# Patient Record
Sex: Female | Born: 1975 | Race: White | Hispanic: No | Marital: Married | Smoking: Never smoker
Health system: Southern US, Community
[De-identification: ages and names within clinical notes are randomized; demographics above are authoritative.]

## PROBLEM LIST (undated history)

## (undated) DIAGNOSIS — R87629 Unspecified abnormal cytological findings in specimens from vagina: Secondary | ICD-10-CM

## (undated) DIAGNOSIS — Z8669 Personal history of other diseases of the nervous system and sense organs: Secondary | ICD-10-CM

## (undated) DIAGNOSIS — IMO0002 Reserved for concepts with insufficient information to code with codable children: Secondary | ICD-10-CM

## (undated) DIAGNOSIS — T4145XA Adverse effect of unspecified anesthetic, initial encounter: Secondary | ICD-10-CM

## (undated) DIAGNOSIS — I10 Essential (primary) hypertension: Secondary | ICD-10-CM

## (undated) DIAGNOSIS — R87619 Unspecified abnormal cytological findings in specimens from cervix uteri: Secondary | ICD-10-CM

## (undated) DIAGNOSIS — Z789 Other specified health status: Secondary | ICD-10-CM

## (undated) DIAGNOSIS — J45909 Unspecified asthma, uncomplicated: Secondary | ICD-10-CM

## (undated) DIAGNOSIS — Z309 Encounter for contraceptive management, unspecified: Secondary | ICD-10-CM

## (undated) HISTORY — PX: WISDOM TOOTH EXTRACTION: SHX21

## (undated) HISTORY — DX: Reserved for concepts with insufficient information to code with codable children: IMO0002

## (undated) HISTORY — DX: Essential (primary) hypertension: I10

## (undated) HISTORY — PX: LAPAROSCOPIC SALPINGOOPHERECTOMY: SUR795

## (undated) HISTORY — DX: Unspecified abnormal cytological findings in specimens from vagina: R87.629

## (undated) HISTORY — PX: APPENDECTOMY: SHX54

## (undated) HISTORY — DX: Personal history of other diseases of the nervous system and sense organs: Z86.69

## (undated) HISTORY — PX: OTHER SURGICAL HISTORY: SHX169

## (undated) HISTORY — DX: Encounter for contraceptive management, unspecified: Z30.9

## (undated) HISTORY — DX: Unspecified abnormal cytological findings in specimens from cervix uteri: R87.619

## (undated) HISTORY — DX: Unspecified asthma, uncomplicated: J45.909

## (undated) HISTORY — PX: ABDOMINAL HYSTERECTOMY: SHX81

---

## 1998-02-18 ENCOUNTER — Inpatient Hospital Stay (HOSPITAL_COMMUNITY): Admission: EM | Admit: 1998-02-18 | Discharge: 1998-03-05 | Payer: Self-pay | Admitting: *Deleted

## 1998-03-11 ENCOUNTER — Inpatient Hospital Stay (HOSPITAL_COMMUNITY): Admission: EM | Admit: 1998-03-11 | Discharge: 1998-03-19 | Payer: Self-pay | Admitting: Emergency Medicine

## 1998-03-20 ENCOUNTER — Encounter (HOSPITAL_COMMUNITY): Admission: RE | Admit: 1998-03-20 | Discharge: 1998-06-18 | Payer: Self-pay

## 1998-04-22 ENCOUNTER — Emergency Department (HOSPITAL_COMMUNITY): Admission: EM | Admit: 1998-04-22 | Discharge: 1998-04-22 | Payer: Self-pay | Admitting: Emergency Medicine

## 1999-02-07 ENCOUNTER — Ambulatory Visit (HOSPITAL_COMMUNITY): Admission: RE | Admit: 1999-02-07 | Discharge: 1999-02-07 | Payer: Self-pay | Admitting: Specialist

## 1999-02-07 ENCOUNTER — Encounter: Payer: Self-pay | Admitting: Specialist

## 2000-07-14 ENCOUNTER — Emergency Department (HOSPITAL_COMMUNITY): Admission: EM | Admit: 2000-07-14 | Discharge: 2000-07-14 | Payer: Self-pay | Admitting: Emergency Medicine

## 2000-07-14 ENCOUNTER — Encounter: Payer: Self-pay | Admitting: Emergency Medicine

## 2000-08-22 ENCOUNTER — Other Ambulatory Visit: Admission: RE | Admit: 2000-08-22 | Discharge: 2000-08-22 | Payer: Self-pay | Admitting: Obstetrics and Gynecology

## 2000-08-25 ENCOUNTER — Encounter: Payer: Self-pay | Admitting: Obstetrics and Gynecology

## 2000-08-25 ENCOUNTER — Ambulatory Visit (HOSPITAL_COMMUNITY): Admission: RE | Admit: 2000-08-25 | Discharge: 2000-08-25 | Payer: Self-pay | Admitting: Obstetrics and Gynecology

## 2000-10-08 ENCOUNTER — Emergency Department (HOSPITAL_COMMUNITY): Admission: EM | Admit: 2000-10-08 | Discharge: 2000-10-08 | Payer: Self-pay | Admitting: Emergency Medicine

## 2000-11-02 ENCOUNTER — Ambulatory Visit (HOSPITAL_COMMUNITY): Admission: RE | Admit: 2000-11-02 | Discharge: 2000-11-02 | Payer: Self-pay | Admitting: Obstetrics and Gynecology

## 2000-11-02 ENCOUNTER — Encounter: Payer: Self-pay | Admitting: Obstetrics and Gynecology

## 2000-12-12 ENCOUNTER — Encounter: Payer: Self-pay | Admitting: Obstetrics and Gynecology

## 2000-12-12 ENCOUNTER — Ambulatory Visit (HOSPITAL_COMMUNITY): Admission: RE | Admit: 2000-12-12 | Discharge: 2000-12-12 | Payer: Self-pay | Admitting: Obstetrics and Gynecology

## 2001-02-12 ENCOUNTER — Encounter: Payer: Self-pay | Admitting: Obstetrics and Gynecology

## 2001-02-12 ENCOUNTER — Ambulatory Visit (HOSPITAL_COMMUNITY): Admission: RE | Admit: 2001-02-12 | Discharge: 2001-02-12 | Payer: Self-pay | Admitting: Obstetrics and Gynecology

## 2001-08-20 ENCOUNTER — Inpatient Hospital Stay (HOSPITAL_COMMUNITY): Admission: EM | Admit: 2001-08-20 | Discharge: 2001-08-29 | Payer: Self-pay | Admitting: Psychiatry

## 2001-09-03 ENCOUNTER — Other Ambulatory Visit (HOSPITAL_COMMUNITY): Admission: RE | Admit: 2001-09-03 | Discharge: 2001-09-08 | Payer: Self-pay | Admitting: Psychiatry

## 2001-09-09 ENCOUNTER — Inpatient Hospital Stay (HOSPITAL_COMMUNITY): Admission: AD | Admit: 2001-09-09 | Discharge: 2001-09-26 | Payer: Self-pay | Admitting: Psychiatry

## 2001-10-03 ENCOUNTER — Encounter: Payer: Self-pay | Admitting: Emergency Medicine

## 2001-10-03 ENCOUNTER — Inpatient Hospital Stay (HOSPITAL_COMMUNITY): Admission: EM | Admit: 2001-10-03 | Discharge: 2001-10-04 | Payer: Self-pay | Admitting: Psychiatry

## 2001-10-03 ENCOUNTER — Emergency Department (HOSPITAL_COMMUNITY): Admission: EM | Admit: 2001-10-03 | Discharge: 2001-10-03 | Payer: Self-pay | Admitting: Emergency Medicine

## 2001-12-12 ENCOUNTER — Emergency Department (HOSPITAL_COMMUNITY): Admission: EM | Admit: 2001-12-12 | Discharge: 2001-12-12 | Payer: Self-pay | Admitting: *Deleted

## 2001-12-12 ENCOUNTER — Encounter: Payer: Self-pay | Admitting: Obstetrics and Gynecology

## 2001-12-12 ENCOUNTER — Ambulatory Visit (HOSPITAL_COMMUNITY): Admission: RE | Admit: 2001-12-12 | Discharge: 2001-12-12 | Payer: Self-pay | Admitting: Obstetrics and Gynecology

## 2001-12-19 ENCOUNTER — Inpatient Hospital Stay (HOSPITAL_COMMUNITY): Admission: RE | Admit: 2001-12-19 | Discharge: 2001-12-21 | Payer: Self-pay | Admitting: Obstetrics & Gynecology

## 2001-12-30 ENCOUNTER — Inpatient Hospital Stay (HOSPITAL_COMMUNITY): Admission: EM | Admit: 2001-12-30 | Discharge: 2001-12-30 | Payer: Self-pay | Admitting: Internal Medicine

## 2002-07-14 ENCOUNTER — Encounter: Payer: Self-pay | Admitting: Internal Medicine

## 2002-07-14 ENCOUNTER — Emergency Department (HOSPITAL_COMMUNITY): Admission: EM | Admit: 2002-07-14 | Discharge: 2002-07-14 | Payer: Self-pay | Admitting: Internal Medicine

## 2002-08-05 ENCOUNTER — Encounter: Payer: Self-pay | Admitting: Preventative Medicine

## 2002-08-05 ENCOUNTER — Ambulatory Visit (HOSPITAL_COMMUNITY): Admission: RE | Admit: 2002-08-05 | Discharge: 2002-08-05 | Payer: Self-pay | Admitting: Preventative Medicine

## 2003-01-27 ENCOUNTER — Ambulatory Visit (HOSPITAL_COMMUNITY): Admission: RE | Admit: 2003-01-27 | Discharge: 2003-01-27 | Payer: Self-pay | Admitting: Family Medicine

## 2003-02-07 ENCOUNTER — Ambulatory Visit (HOSPITAL_COMMUNITY): Admission: RE | Admit: 2003-02-07 | Discharge: 2003-02-07 | Payer: Self-pay | Admitting: Family Medicine

## 2003-05-01 ENCOUNTER — Ambulatory Visit (HOSPITAL_COMMUNITY): Admission: RE | Admit: 2003-05-01 | Discharge: 2003-05-01 | Payer: Self-pay | Admitting: Family Medicine

## 2003-05-19 ENCOUNTER — Ambulatory Visit (HOSPITAL_COMMUNITY): Admission: RE | Admit: 2003-05-19 | Discharge: 2003-05-19 | Payer: Self-pay | Admitting: Internal Medicine

## 2003-06-02 ENCOUNTER — Ambulatory Visit (HOSPITAL_COMMUNITY): Admission: RE | Admit: 2003-06-02 | Discharge: 2003-06-02 | Payer: Self-pay | Admitting: Obstetrics and Gynecology

## 2003-06-12 ENCOUNTER — Emergency Department (HOSPITAL_COMMUNITY): Admission: EM | Admit: 2003-06-12 | Discharge: 2003-06-12 | Payer: Self-pay | Admitting: Emergency Medicine

## 2003-10-07 ENCOUNTER — Emergency Department (HOSPITAL_COMMUNITY): Admission: EM | Admit: 2003-10-07 | Discharge: 2003-10-07 | Payer: Self-pay | Admitting: Emergency Medicine

## 2003-11-14 ENCOUNTER — Emergency Department (HOSPITAL_COMMUNITY): Admission: EM | Admit: 2003-11-14 | Discharge: 2003-11-14 | Payer: Self-pay | Admitting: Emergency Medicine

## 2003-12-27 ENCOUNTER — Emergency Department (HOSPITAL_COMMUNITY): Admission: EM | Admit: 2003-12-27 | Discharge: 2003-12-28 | Payer: Self-pay | Admitting: *Deleted

## 2004-04-14 ENCOUNTER — Ambulatory Visit (HOSPITAL_COMMUNITY): Admission: RE | Admit: 2004-04-14 | Discharge: 2004-04-14 | Payer: Self-pay | Admitting: Obstetrics & Gynecology

## 2004-10-27 ENCOUNTER — Other Ambulatory Visit: Admission: RE | Admit: 2004-10-27 | Discharge: 2004-10-27 | Payer: Self-pay | Admitting: Obstetrics & Gynecology

## 2004-11-24 ENCOUNTER — Other Ambulatory Visit: Admission: RE | Admit: 2004-11-24 | Discharge: 2004-11-24 | Payer: Self-pay | Admitting: Obstetrics & Gynecology

## 2004-12-22 ENCOUNTER — Ambulatory Visit (HOSPITAL_COMMUNITY): Admission: RE | Admit: 2004-12-22 | Discharge: 2004-12-22 | Payer: Self-pay | Admitting: Preventative Medicine

## 2004-12-30 ENCOUNTER — Encounter (HOSPITAL_COMMUNITY): Admission: RE | Admit: 2004-12-30 | Discharge: 2005-01-29 | Payer: Self-pay | Admitting: Preventative Medicine

## 2005-01-31 ENCOUNTER — Ambulatory Visit: Payer: Self-pay | Admitting: Orthopedic Surgery

## 2005-05-15 ENCOUNTER — Emergency Department (HOSPITAL_COMMUNITY): Admission: EM | Admit: 2005-05-15 | Discharge: 2005-05-15 | Payer: Self-pay | Admitting: Emergency Medicine

## 2006-03-11 ENCOUNTER — Emergency Department (HOSPITAL_COMMUNITY): Admission: EM | Admit: 2006-03-11 | Discharge: 2006-03-11 | Payer: Self-pay | Admitting: Emergency Medicine

## 2006-03-15 ENCOUNTER — Ambulatory Visit (HOSPITAL_COMMUNITY): Admission: RE | Admit: 2006-03-15 | Discharge: 2006-03-15 | Payer: Self-pay | Admitting: Orthopaedic Surgery

## 2007-03-13 ENCOUNTER — Other Ambulatory Visit: Admission: RE | Admit: 2007-03-13 | Discharge: 2007-03-13 | Payer: Self-pay | Admitting: Obstetrics and Gynecology

## 2009-03-07 DIAGNOSIS — T8859XA Other complications of anesthesia, initial encounter: Secondary | ICD-10-CM

## 2009-03-07 HISTORY — DX: Other complications of anesthesia, initial encounter: T88.59XA

## 2009-05-07 ENCOUNTER — Other Ambulatory Visit: Admission: RE | Admit: 2009-05-07 | Discharge: 2009-05-07 | Payer: Self-pay | Admitting: Obstetrics and Gynecology

## 2009-07-01 ENCOUNTER — Ambulatory Visit (HOSPITAL_COMMUNITY): Admission: RE | Admit: 2009-07-01 | Discharge: 2009-07-01 | Payer: Self-pay | Admitting: Obstetrics & Gynecology

## 2009-11-25 ENCOUNTER — Encounter: Payer: Self-pay | Admitting: Obstetrics & Gynecology

## 2009-11-25 ENCOUNTER — Inpatient Hospital Stay (HOSPITAL_COMMUNITY): Admission: RE | Admit: 2009-11-25 | Discharge: 2009-11-28 | Payer: Self-pay | Admitting: Obstetrics & Gynecology

## 2010-03-27 ENCOUNTER — Encounter: Payer: Self-pay | Admitting: Family Medicine

## 2010-03-28 ENCOUNTER — Encounter: Payer: Self-pay | Admitting: Internal Medicine

## 2010-05-20 LAB — CBC
HCT: 31.1 % — ABNORMAL LOW (ref 36.0–46.0)
HCT: 36.7 % (ref 36.0–46.0)
Hemoglobin: 10.9 g/dL — ABNORMAL LOW (ref 12.0–15.0)
Hemoglobin: 12.5 g/dL (ref 12.0–15.0)
MCH: 31 pg (ref 26.0–34.0)
MCH: 32 pg (ref 26.0–34.0)
MCHC: 34.1 g/dL (ref 30.0–36.0)
MCHC: 35.1 g/dL (ref 30.0–36.0)
MCV: 91 fL (ref 78.0–100.0)
MCV: 91 fL (ref 78.0–100.0)
Platelets: 181 10*3/uL (ref 150–400)
Platelets: 231 10*3/uL (ref 150–400)
RBC: 3.42 MIL/uL — ABNORMAL LOW (ref 3.87–5.11)
RBC: 4.04 MIL/uL (ref 3.87–5.11)
RDW: 14.6 % (ref 11.5–15.5)
RDW: 14.8 % (ref 11.5–15.5)
WBC: 10.9 10*3/uL — ABNORMAL HIGH (ref 4.0–10.5)
WBC: 8.8 10*3/uL (ref 4.0–10.5)

## 2010-05-20 LAB — RPR: RPR Ser Ql: NONREACTIVE

## 2010-05-20 LAB — SURGICAL PCR SCREEN
MRSA, PCR: NEGATIVE
Staphylococcus aureus: NEGATIVE

## 2010-07-23 NOTE — H&P (Signed)
NAME:  Katherine Davila, Katherine Davila                           ACCOUNT NO.:  [id number]   MEDICAL RECORD NO.:  [id number]                   PATIENT TYPE:  AMB   LOCATION:  DAY                                  FACILITY:  [facility]   PHYSICIAN:  Lazaro Arms, M.D.                DATE OF BIRTH:  [birth year]   DATE OF ADMISSION:  12/19/2001  DATE OF DISCHARGE:                                HISTORY & PHYSICAL   HISTORY OF PRESENT ILLNESS:  The patient is a 35 year old gravida 1, para 0,  abortus 1, with last menstrual period of September 13th, and a Depo-Provera  for birth control, who has ongoing symptoms of right lower quadrant pain.  The patient has been seen in the ER twice and in our office on several  occasions.  She has had gonorrhea and Chlamydia which are negative, negative  urine cultures.  She has been treated empirically for infection.  Her GC and  Chlamydia cultures were negative.  She had a transvaginal ultrasound which  showed a relatively small right ovarian cyst of 1.8 x 1.8 x 1.0 cm,  consistent with hemorrhagic corpus luteum.  On examination, this is what is  exquisitely tender.  The patient has more nausea and vomiting than we  normally see with this, I do not think there is ovarian torsion, so I have  also permitted her for a laparoscopic appendectomy.   PAST MEDICAL HISTORY:  She is bipolar I.   PAST SURGICAL HISTORY:  Negative.   PAST OBSTETRICAL HISTORY:  Miscarriage.   MEDICATIONS:  Her medications include Depakote, Geodon,  Ambien, Effexor and  Klonopin.   ALLERGIES:  Her allergies are to PENICILLIN and ERYTHROMYCIN.   REVIEW OF SYSTEMS:  As per the HPI.   PHYSICAL EXAMINATION:  VITAL SIGNS:  Blood pressure is 110/80.  Her weight  is 146 pounds.  HEENT:  Unremarkable.  NECK:  Her thyroid is normal.  LUNGS:  Lungs are clear.  HEART:  Heart is regular rate and rhythm without murmur, regurgitation or  gallop.  BREASTS:  Breasts are without mass, discharge or skin  changes.  ABDOMEN:  Benign except for tenderness in the right lower quadrant.  No  rebound.  PELVIC:  Her pelvic reveals no cervical motion tenderness, tenderness in the  right adnexa.  Uterus is nontender.  Left adnexa was negative.  EXTREMITIES:  Extremities are warm with no edema.  NEUROLOGIC:  Exam is grossly intact.   IMPRESSION:  1. Right lower quadrant pain.  2. Unresponsive to conservative measures.  3. Right ovarian cyst.   PLAN:  The patient has more peritoneal signs than I am used to, so she is  permitted for possible laparoscopic appendectomy as well as cystectomy.  The  patient understands risks, benefits, indications and alternatives and will  proceed.  Lazaro Arms, M.D.    Loraine Maple  D:  12/19/2001  T:  12/19/2001  Job:  [id number]

## 2010-07-23 NOTE — Discharge Summary (Signed)
   NAME:  Katherine Davila, Katherine Davila                           ACCOUNT NO.:  [id number]   MEDICAL RECORD NO.:  [id number]                   PATIENT TYPE:  INP   LOCATION:  A303                                 FACILITY:  [facility]   PHYSICIAN:  Lazaro Arms, M.D.                DATE OF BIRTH:  May 13, 1975   DATE OF ADMISSION:  12/19/2001  DATE OF DISCHARGE:  12/21/2001                                 DISCHARGE SUMMARY   DISCHARGE DIAGNOSIS:  Status post laparoscopic right ovarian cystectomy and  laparoscopic appendectomy.   HISTORY OF PRESENT ILLNESS:  Please refer to the history and physical and  the dictated operative note for details on admission to the hospital.   HOSPITAL COURSE:  The patient is bipolar I and had made a suicidal gesture  earlier this year and, as a result, I wanted to keep her in the hospital  postoperatively.  She has a high need level, and that was confirmed with her  postoperative stay.  She had a lot of complaints of pain but would sleep  normally.  On the morning of the second postoperative day her H&H was  stable.  Her incisions were clean, dry, and intact.  She had some bruising  around the umbilical incision which was predictable because she had some  muscle oozing, but she remained afebrile, with stable vital signs.  Tolerating clear liquids and a regular diet.  Voided without symptoms and  ambulated without symptoms.  She was discharged home in good and stable  condition, to follow up in the office on Monday as scheduled.  She was given  Toradol and Demerol for pain and Levaquin prophylactically because of the  appendectomy.                                               Lazaro Arms, M.D.    Loraine Maple  D:  12/21/2001  T:  12/21/2001  Job:  [id number]

## 2010-07-23 NOTE — Procedures (Signed)
   NAME:  Katherine Davila, Katherine Davila                           ACCOUNT NO.:  [id number]   MEDICAL RECORD NO.:  [id number]                   PATIENT TYPE:  INP   LOCATION:  IC08                                 FACILITY:  [facility]   PHYSICIAN:  Edward L. Juanetta Gosling, M.D.             DATE OF BIRTH:  Apr 11, 1975   DATE OF PROCEDURE:  DATE OF DISCHARGE:  12/30/2001                                EKG INTERPRETATION   TIME AND DATE:  5:05 on 12/30/01   INTERPRETATION:  The rhythm is sinus rhythm with a rate in the 70s.   IMPRESSION:  Otherwise normal electrocardiogram.                                               Edward L. Juanetta Gosling, M.D.    ELH/MEDQ  D:  12/31/2001  T:  01/01/2002  Job:  [id number]

## 2010-07-23 NOTE — H&P (Signed)
NAME:  Katherine Davila, Katherine Davila                           ACCOUNT NO.:  [id number]   MEDICAL RECORD NO.:  [id number]                   PATIENT TYPE:  AMB   LOCATION:  DAY                                  FACILITY:  [facility]   PHYSICIAN:  Tilda Burrow, M.D.              DATE OF BIRTH:  [birth year]   DATE OF ADMISSION:  06/02/2003  DATE OF DISCHARGE:                                HISTORY & PHYSICAL   ADMITTING DIAGNOSES:  1. Pelvic discomfort, uterine retroversion.  2. History of ovarian cyst requiring laparoscopic removal.  3. Bipolar disorder with history of bulimia and multiple suicidal gestures.   HISTORY OF PRESENT ILLNESS:  This 35 year old female, gravida 1 para 0, with  a longstanding history of bipolar disorder, is admitted at this time for day  surgery at [facility] for laparoscopic evaluation of the pelvis and  consideration of uterine suspension.  Ms. Katherine Davila has been seen in our office  in followup of her 2003 laparoscopy.  She has developed complaints of pelvic  discomfort, more on the right than the left.  She has a history of right  ovarian cystectomy and appendectomy.  She is not sexually active at present,  has a stable relationship, has, over the past year, apparently done well  with her bipolar disorder, and multiple upwards of 70 suicide attempts  which have resulted in multiple long-term psychiatric hospitalizations.  Most recently hospitalization in this hospital was December 30, 2001, a  couple of months after a laparoscopy, when she had made a suicidal gesture  on the anniversary of a miscarriage.   MEDICATIONS:  Unknown doses but include Depakote, Geodon, __________ ,  Effexor XR.   ALLERGIES:  PENICILLIN and ERYTHROMYCIN.   SOCIAL HISTORY:  The patient has an ongoing stable relationship at the  current time.   PHYSICAL EXAM:  GENERAL:  Height 5 feet 5 inches, weight approximately 140.  HEENT:  Pupils equal, round and reactive.  Extraocular movements  intact.  CARDIOVASCULAR:  Exam unremarkable.  ABDOMEN:  Abdomen nontender.  PELVIC:  Normal external genitalia.  Uterus retroverted, non-purulent  cervix.  Adnexa:  Vague tenderness, as is tenderness noted with the uterine  contact in its retroverted position during bimanual exam.   PLAN:  Plan is for laparoscopic evaluation of the right adnexa to rule out  adhesions, remove them if noted, evaluate the right tube and ovary and  release any adhesions in that area.  Additionally, plans are to either  shorten the round ligaments or to suspend the uterus to the anterior  abdominal wall to reduce the retroversion and associated pelvic discomfort.  The patient understands that these are usually procedures that subsequently  work for a period of time  only and that subsequent childbearing, if it should occur, will likely re-  lengthen the ligaments and lead to recurrence of any uterine retroversion  and pelvic discomfort related to it.  Additionally, the patient has had  specific review that this is not a guaranteed procedure and that pelvis  discomfort may persist, even after uterine suspension.     ___________________________________________                                         Tilda Burrow, M.D.   JVF/MEDQ  D:  06/02/2003  T:  06/02/2003  Job:  [id number]

## 2010-07-23 NOTE — H&P (Signed)
NAME:  Katherine Davila NO.:  [id number]   MEDICAL RECORD NO.:  [id number]          PATIENT TYPE:  AMB   LOCATION:  DAY                           FACILITY:  [facility]   PHYSICIAN:  Lazaro Arms, M.D.   DATE OF BIRTH:  1975/03/10   DATE OF ADMISSION:  04/14/2004  DATE OF DISCHARGE:  LH                                HISTORY & PHYSICAL   HISTORY OF PRESENT ILLNESS:  Katherine Davila is a 35 year old, white female, gravida  0, para 0, who has been having ongoing right lower quadrant pain.  Of note,  the patient has an extensive psychiatric history including multiple suicide  attempts, most recently she has been stable and actually doing pretty well  as far as that goes.  The patient has really been complaining about right  lower quadrant pain for the last several years.  We ended up doing a  laparoscopic right ovarian cystectomy and a laparoscopic appendectomy, back  on December 19, 2001.  The rest of the pelvis was normal.  There was no  endometriosis seen at that time.  The patient then seemed to do much better;  however, she began to have pelvic pain again and, on June 02, 2003, the  patient had once again diagnostic laparoscopy and was found to have minimal  to mild endometriosis and a small amount of clinically insignificant  adhesions to the anterior abdominal wall.  The patient continued to have  pain and I put her on six months of Lupron at which time her pain did  improve, and then continuous birth control pills and her pain has returned.  It seems to be again markedly on the right and as a result of ongoing pain,  despite all conservative measures, we are proceeding with a laparoscopic  right salpingo-oophorectomy.  The patient understands risks, benefits,  indications, and alternatives and desires to proceed.   PAST MEDICAL HISTORY:  1.  Bipolar.  2.  Posttraumatic stress disorder.  3.  An extensive psychiatric with multiple suicidal gestures.   PAST SURGICAL HISTORY:   As stated above.   PAST OBSTETRICAL HISTORY:  Negative.   ALLERGIES:  1.  ERYTHROMYCIN.  2.  PENICILLIN.   REVIEW OF SYSTEMS:  Otherwise negative.   PHYSICAL EXAMINATION:  HEENT:  Unremarkable.  Thyroid is normal.  LUNGS:  Clear.  HEART:  Regular rate and rhythm without regurg, gallop.  BREASTS:  Without masses, discharge, skin changes.  ABDOMEN:  Benign with tenderness in the right lower quadrant.  No rebound.  The rest of the abdominal exam is benign.  PELVIC:  She has no cervical motion tenderness.  She has normal external  genitalia.  moist without discharge.  The left adnexa is negative.  The  uterus is negative.  The right adnexa, however, is normal size but tender to  palpation.  EXTREMITIES:  Warm and no edema.  NEUROLOGIC:  Grossly intact.   IMPRESSION:  Ongoing right lower quadrant pain, despite conservative  measures.   PLAN:  The patient is admitted for a laparoscopic right salpingo-  oophorectomy.  She understands risks,  benefits, indications, and  alternatives.  We will proceed.      LHE/MEDQ  D:  04/13/2004  T:  04/13/2004  Job:  [id number]

## 2010-07-23 NOTE — Discharge Summary (Signed)
[facility]  Patient:    Katherine Davila, Katherine Davila Visit Number: [id number] MRN: [id number]          Service Type: PSY Location: 300 [id number] 01 Attending Physician:  Rachael Fee Dictated by:   Jeanice Lim, M.D. Admit Date:  09/09/2001 Discharge Date: 09/26/2001                             Discharge Summary  IDENTIFYING DATA:  This is a 35 year old single Caucasian female voluntarily admitted for depression status post intentional overdose.  MEDICATIONS:  Effexor, Klonopin, Ambien, birth control pill and Darvocet.  ALLERGIES:  PENICILLIN and ERYTHROMYCIN.  PHYSICAL EXAMINATION:  Essentially within normal limits.  Neurologically nonfocal.  LABORATORY DATA:  Routine admission labs within normal limits.  Potassium slightly low at 3.4.  Urine drug screen negative.  MENTAL STATUS EXAMINATION:  Alert, young adult female with good eye contact. Cooperative.  Speech clear.  Mood depressed.  Affect flat, angry at the end. Thought process goal directed.  Thought content negative for dangerous ideation or psychotic symptoms.  Cognitively intact.  Judgment and insight impaired.  ADMISSION DIAGNOSES: Axis I:    1. Bipolar disorder, depressed.            2. Bulimia nervosa. Axis II:   None. Axis III:  History of ovarian cyst. Axis IV:   Moderate (problems with primary support and other psychosocial            issues). Axis V:    35/55.  HOSPITAL COURSE:  The patient was admitted following an overdose on Klonopin, taking 10 mg in the past 24 hours.  The patient denied suicidal intent stating that she just wanted to sleep.  Effexor was optimized for depressive symptoms. Klonopin was decreased in dose and patient had a nutrition consult regarding an eating disorder.  The patient would not take in adequate fluids or food initially and was somewhat resistant to treatment.  Irritable and angry with some suicidal thoughts.  The patient gradually improved as  medications were stabilized.  Klonopin was tapered due to excessive sedation and Neurontin decreased secondary to lack of response.  The patient gradually became more cooperative, participating in individual, group and milieu therapy, benefiting from clinical intervention and medication changes.  The patient agreed to follow up with intensive outpatient.  Family meetings were held to discuss aftercare and family was comfortable with discharge plan.  The patient denied any suicidal ideation at the time of discharge as well as her mood was more euthymic.  Affect brighter.  CONDITION ON DISCHARGE:  Improved.  DISCHARGE MEDICATIONS: 1. Darvocet 2 q.8h. p.r.n. pain.  Given a one-week supply. 2. Effexor XR 75 mg, 2 q.a.m. and 1 at 3 p.m. 3. Ambien 10 mg q.h.s. p.r.n. 4. Flexeril 10 mg q.12h. p.r.n. spasm. 5. Klonopin 0.5 mg, 1/2 q.a.m., 1/2 at 3 p.m. and 1 q.h.s.  FOLLOW-UP:  Intensive outpatient program Monday, September 03, 2001 at 9 a.m.  DISCHARGE DIAGNOSES: Axis I:    1. Bipolar disorder, depressed.            2. Bulimia nervosa. Axis II:   Borderline personality disorder. Axis III:  History of ovarian cyst. Axis IV:   Moderate (problems with primary support and other psychosocial            issues). Axis V:    Global Assessment of Functioning on discharge 55. Dictated by:   Jeanice Lim, M.D.  Attending Physician:  Rachael Fee DD:  09/26/01 TD:  09/28/01 Job: [id number]

## 2010-07-23 NOTE — H&P (Signed)
[facility]  Patient:    Katherine Davila, Katherine Davila Visit Number: [id number] MRN: [id number]          Service Type: EMS Location: ED Attending Physician:  Carmelina Peal Dictated by:   Candi Leash. Orsini, N.P. Admit Date:  08/20/2001 Discharge Date: 08/20/2001                     Psychiatric Admission Assessment  DATE OF ADMISSION:  August 20, 2001  PATIENT IDENTIFICATION:  This is a 35 year old single white female voluntarily admitted for depression and intentional overdose on August 20, 2001.  HISTORY OF PRESENT ILLNESS:  The patient presents with a history of depression, feeling very hopeless and helpless, requesting assistance so that she will not hurt herself.  The patient had an appointment with her psychiatrist on Monday.  The patient had reported to her psychiatrist she had taken 10 Klonopin on Sunday night so that she would sleep, denying any suicidal thoughts at that time.  She states she feels very miserable and angry and stressed over her breakup with her fiancee, her loss of pregnancy in October 2002.  She states that she "would like to die more than anything else but I cant do it."  She reports a history of 3 suicide attempts with multiple different ways of trying to hurt herself.  States her sleep has been decreased, appetite is decreased.  She has a history of bulimia.  The patient states that she will not eat and has difficulty taking pills.  The patient says she wants her father to be here so that can eat.  PAST PSYCHIATRIC HISTORY:  History of severe depression.  Sees Dr. Evelene Croon as an outpatient; last saw him on August 20, 2001.  First hospitalization to [facility].  Has a history of multiple suicide attempts, poisoning self with Lysol, cutting her wrists, overdosing, and lying on train tracks.  She states her last hospitalization was at [facility] where she was hospitalized for eight months.  SUBSTANCE ABUSE HISTORY:   Nonsmoker.  Rare alcohol intake.  Denies any substance abuse.  PAST MEDICAL HISTORY:  Primary care Rilea Arutyunyan: None.  Medical problems: History of bulimia.  MEDICATIONS: 1. Effexor, has been on that for years.  The patient states she has been on    and off and recently started her medication two weeks ago. 2. Klonopin 1 mg t.i.d. 3. Birth control pills. 4. Ambien 10 mg q.h.s. 5. Darvocet for pain.  DRUG ALLERGIES:  PENICILLIN and ERYTHROMYCIN.  PHYSICAL EXAMINATION:  GENERAL:  Performed at [facility] Emergency Department.  LABORATORY DATA:  CBC: Within normal limits.  Potassium 3.4, BUN 4.  Urine drug screen was negative.  Alcohol level was less than 5.  SOCIAL HISTORY:  She is a 35 year old single white female.  She states she lost a child when she was nine weeks pregnant in October 2002.  She lives with her parents and her brother.  She is on disability for psychiatric problems.  Completed one and a half years of college.  No legal problems.  FAMILY HISTORY:  Grandfather committed suicide.  MENTAL STATUS EXAMINATION:  She is an alert, young adult female, good eye contact.  She is cooperative, wrapped up in a blanket.  Speech is clear.  Mood is depressed.  Affect is flat.  The patient became very angry at the end of the session when was told to drink some fluids and that her father could not be here for her  meals.  Thought processes are coherent.  There is no evidence of psychosis, no auditory or visual hallucinations, no suicidal or homicidal ideations, no delusions or paranoia.  Cognitive: Intact.  Memory is good. Judgment is impaired.  Insight is poor.  ADMISSION DIAGNOSES: Axis I:    1. Bipolar disorder, depressed.            2. Bulimia, by history. Axis II:   Deferred. Axis III:  Ovarian cyst. Axis IV:   Problems with primary support group, grief issues with lost            pregnancy, other psychosocial problems. Axis V:    Current is 35, estimated this past year is  26.  INITIAL PLAN OF CARE:  Plan is a voluntary admission to [facility] for depression and questionable overdose.  Contract for safety.  Check every 15 minutes.  Will resume her Effexor and Klonopin.  Will increase her Effexor to decrease depressive symptoms.  Check a urine pregnancy test. Monitor food and fluid intake.  Follow up with Dr. Evelene Croon.  The patient to be medication compliant, increase coping skills.  ESTIMATED LENGTH OF STAY:  Three to five days or more depending on the patients response. Dictated by:   Candi Leash. Orsini, N.P. Attending Physician:  Carmelina Peal DD:  08/22/01 TD:  08/22/01 Job: [id number]

## 2010-07-23 NOTE — Op Note (Signed)
NAME:  Katherine Davila, Katherine Davila                 ACCOUNT NO.:  [id number]   MEDICAL RECORD NO.:  [id number]          PATIENT TYPE:  AMB   LOCATION:  DAY                           FACILITY:  [facility]   PHYSICIAN:  Lazaro Arms, M.D.   DATE OF BIRTH:  Aug 30, 1975   DATE OF PROCEDURE:  04/14/2004  DATE OF DISCHARGE:                                 OPERATIVE REPORT   PREOPERATIVE DIAGNOSES:  1.  Chronic right lower quadrant pain status post two previous surgeries.  2.  Minimal to mild endometriosis status post Lupron for 6 months.   PROCEDURE:  1.  Laparoscopic right salpingo-oophorectomy.  2.  Ablation of peritoneal endometriosis.   SURGEON:  Dr. Despina Hidden.   ANESTHESIA:  General endotracheal.   FINDINGS:  The patient had minimal endometriosis of the left utero-sacral  ligament and perineum. She also had some deep in her cul-de-sac on the  right. There was none in her ovaries or any other peritoneal surfaces. There  was no adhesive disease.   DESCRIPTION OF PROCEDURE:  The patient was taken to the operating room,  placed in a supine position where she underwent general endotracheal  anesthesia. She was placed in the dorsal lithotomy position and prepped and  draped in the usual sterile fashion. Incision was made in the umbilicus, and  Veress was used, and the peritoneal cavity was insufflated. An 11-mm trocar  nonbladed was used with direct visualization. The peritoneal cavity was  entered with one pass without difficulty. Two additional nonbladed trocars  were placed in the peritoneal cavity, a 5-mm suprapubic and a 11-mm right  lower quadrant. The above noted findings were seen. The 10-mm Gyrus  alligator forceps were used and coagulation applied, and the right  oophorectomy was performed. There were no adhesions. Peritoneal cavity was  normal. Endometriosis was seen and was coagulated using bipolar cautery. The  peritoneal cavity was irrigated vigorously. All pedicles found to be  hemostatic. The  trocars were removed. The fascia was closed using 0 Vicryl  interrupted sutures in the umbilical and right lower quadrant. The skin was  closed using skin staples. The patient tolerated the procedure well. She  experienced minimal blood loss, was taken to the recovery room in good  stable condition. All counts were correct. She received Ancef  prophylactically.     LHE/MEDQ  D:  04/14/2004  T:  04/14/2004  Job:  [id number]

## 2010-07-23 NOTE — H&P (Signed)
[facility]  Patient:    Katherine Davila, Katherine Davila Visit Number: [id number] MRN: [id number]          Service Type: PSY Location: 300 [id number] Attending Physician:  Rachael Fee Dictated by:   Candi Leash. Orsini, N.P. Admit Date:  09/09/2001 Discharge Date: 09/26/2001                     Psychiatric Admission Assessment  IDENTIFYING INFORMATION:  This is a 35 year old single white female involuntarily committed for intentional overdose on September 09, 2001.  HISTORY OF PRESENT ILLNESS:  The patient presents with intentional overdose on two separate occasions, taking 15 Klonopin 1 mg tablets on Friday, then taking 20 Klonopin 1 mg tablets and 3 Remeron tablets on Sunday; reporting this was over a conflict with her mother.  Petition papers report that patient has overdosed on Klonopin in the past.  Remains unable to contract for safety. The patient states she took the medicine to relieve emotional pain.  On Friday, she took the medicine.  States that she, afterwards, went to a friends house and came back on Sunday and then took 20 more in addition with the Remeron.  The patient states that she then took herself to [facility] anticipating to be admitted.  The patient is stating that she will not drink any fluids and also is very angry at her nurse.  States that she will only lie in bed until she is discharged.  PAST PSYCHIATRIC HISTORY:  Was seeing Dr. Ladona Ridgel in the IOP program.  Was hospitalized for eight months at [facility].  The patient was recently discharged from [facility] approximately 1-1/2 weeks ago for intentional overdose on Klonopin.  Was seeing Dr. Evelene Croon as an outpatient.  She has a history of bipolar disorder and reports, with her last admission, that she had 49 suicide attempts.  SOCIAL HISTORY:  This is a 35 year old single white female.  She has a history of miscarriage in October of 2002.  She is living with her mother.   She reports no legal problems.  She has completed 1-1/2 years of college.  FAMILY HISTORY:  Grandfather committed suicide.  ALCOHOL/DRUG HISTORY:  Nonsmoker.  Rare alcohol intake.  Denies any substance abuse.  PRIMARY CARE Zebulon Gantt:  None.  MEDICAL PROBLEMS:  History of bulimia.  MEDICATIONS:  Klonopin 1 mg t.i.d., birth control pills, Effexor XR 150 mg in the morning, 75 mg q.p.m.  DRUG ALLERGIES:  PENICILLIN, ERYTHROMYCIN.  PHYSICAL EXAMINATION:  Performed at [facility].  LABORATORY DATA:  CBC within normal limits.  CMET within normal limits. Lipase is 14.  Urine pregnancy test is negative.  Acetaminophen level less than 10.  Urine drug screen was positive for benzodiazepines.  Alcohol level is less than 5.  Urinalysis showed yellow, hazy, ketones 15, moderate blood, positive nitrites.  Abnormal EKG.  MENTAL STATUS EXAMINATION:  She is an alert, young adult, cooperative female with good eye contact.  Speech is clear.  Mood is depressed and angry.  Affect is constricted.  Thought processes are coherent.  There is no evidence of psychosis.  No suicidal or homicidal ideation.  No auditory or visual hallucinations.  Cognitive function intact.  Memory is fair.  Judgment and insight is poor.  Poor impulse control.  DIAGNOSES: Axis I:    Major depression, recurrent. Axis II:   Deferred. Axis III:  History of bulimia. Axis IV:   Problems with primary support group, other psychosocial problems. Axis V:  Current 25; this past year 67.  PLAN:  Involuntary commitment for intentional overdose.  Will contract for safety.  Check every 15 minutes.  Will encourage fluids.  Will monitor food and fluid intake.  Treatment plan was discussed with Dr. Kathrynn Running.  Will discontinued her Klonopin, initiate the _______ protocol.  Have Seroquel available for agitation.  To increase patients coping skills, have a family session.  To follow up with Dr. Evelene Croon.  TENTATIVE LENGTH OF STAY:  Three to  five days. Dictated by:   Candi Leash. Orsini, N.P. Attending Physician:  Rachael Fee DD:  09/13/01 TD:  09/15/01 Job: [id number]

## 2010-07-23 NOTE — Discharge Summary (Signed)
NAME:  Katherine Davila, Katherine Davila                           ACCOUNT NO.:  [id number]   MEDICAL RECORD NO.:  [id number]                   PATIENT TYPE:  IPS   LOCATION:  0302                                 FACILITY:  [facility]   PHYSICIAN:  Jeanice Lim, MD                DATE OF BIRTH:  25-Jan-1976   DATE OF ADMISSION:  09/09/2001  DATE OF DISCHARGE:  09/26/2001                                 DISCHARGE SUMMARY   IDENTIFYING DATA:  This is a 35 year old single Caucasian female,  involuntarily committed for intentional overdose on 2 separate occasions,  taking 15 Klonopin 1 mg tablets and several Remeron after being in conflict  with her mother.   ADMISSION MEDICATIONS:  Klonopin 1 mg t.i.d., birth control pills, Effexor  XR 150 mg q.a.m. and 75 mg at  3 p.m.   ALLERGIES:  PENICILLIN, ERYTHROMYCIN.   PHYSICAL EXAMINATION:  Essentially within normal limits, neurologically  nonfocal.   ROUTINE ADMISSION LABORATORIES:  CBC and CMET within normal limits. Urine  pregnancy test negative.   MENTAL STATUS EXAM:  Alert, young adult, cooperative female with good eye  contact.  Speech clear, mood depressed and angry.  Affect constricted.  Thought process goal directed.  Thought content negative psychotic symptoms,  denied suicidal or homicidal ideation at that time, and cognitively intact.  Judgment and insight poor.   ADMISSION DIAGNOSES:   AXIS I:  1. Major depression, recurrent, severe.  2. Rule out bipolar disorder type II, mixed.   AXIS II:  Borderline personality disorder.   AXIS III:  History of bulimia.   AXIS IV:  Severe, problems with primary support system.   AXIS V:  25/55.   HOSPITAL COURSE:  The patient was admitted and ordered routine p.r.n.  medications, underwent further monitoring, was encouraged to participate in  individual, group and milieu therapy, was detoxed with Librium due to 3  previous overdoses on Klonopin and concern that this increased her  impulsivity.  The  patient complained of ongoing depressive symptoms, was  quite oppositional, resistant to staff encouraging treatment, frequently  requesting medications to help her with situational stress.  The patient had  a great deal of interpersonal conflict with other patient and staff and  continued to report suicidal ideation, with plan to overdose if released  from the hospital.  The patient was optimized on Geodon and Trileptal to  further stabilize mood and control agitation and mood lability.  The patient  also was given Seroquel p.r.n. which was partially effectively with acute  agitation.  The patient eventually reported a positive response to  medication changes and appeared to have worked out some of the conflicts she  had with her family.  A family meeting was held and aftercare discussed and  the patient and family were comfortable with the aftercare plan.   CONDITION ON DISCHARGE:  Improved.  Mood was more euthymic, affect brighter,  thought process goal directed.  Thought content negative for dangerous  ideation or psychotic symptoms.  The patient reported motivation to be  compliant with followup plan and take medications as directed and avoid  substances of abuse.   DISCHARGE MEDICATIONS:  1. Topamax 25 mg q.a.m. and q.h.s.  2. Seroquel 100 mg q.8h. p.r.n. agitation.  3. Trileptal 150 mg q.a.m. and at 3 p.m. and 4 q.h.s.  4. Effexor XR 150 mg q.a.m. and at 3 p.m.  5. Ziprasidone 40 mg 2 q.a.m. and 1 at 6 p.m. with food.  6. Ambien 10 mg q.h.s.  7. Vistaril 25 mg t.i.d.   DISPOSITION:  The patient was discharged to follow up with Dr. Evelene Croon on  August 20 at 10:30 and Dr. Mikle Bosworth August 21 at 10:15 for therapy.   DISCHARGE DIAGNOSES:   AXIS I:  1. Major depression, recurrent, severe.  2. Rule out bipolar disorder type II, mixed.   AXIS II:  Borderline personality disorder.   AXIS III:  History of bulimia.   AXIS IV:  Severe, problems with primary support system.   AXIS  V:  Global assessment of function on discharge was 55.                                                Jeanice Lim, MD    JEM/MEDQ  D:  11/01/2001  T:  11/04/2001  Job:  [id number]

## 2010-07-23 NOTE — Op Note (Signed)
NAME:  Katherine Davila, Katherine Davila                           ACCOUNT NO.:  [id number]   MEDICAL RECORD NO.:  [id number]                   PATIENT TYPE:  AMB   LOCATION:  DAY                                  FACILITY:  [facility]   PHYSICIAN:  Tilda Burrow, M.D.              DATE OF BIRTH:  1975-07-09   DATE OF PROCEDURE:  DATE OF DISCHARGE:  06/02/2003                                 OPERATIVE REPORT   PREOPERATIVE DIAGNOSIS:  Pelvic pain.   POSTOPERATIVE DIAGNOSIS:  Pelvic pain, endometriosis.   PROCEDURE:  Diagnostic laparoscopy with lysis of adhesions.   SURGEON:  Tilda Burrow, M.D.   ASSISTANT:  None.   ANESTHESIA:  General.   COMPLICATIONS:  None.   ESTIMATED BLOOD LOSS:  Minimal.   FINDINGS:  No clinically-significant adhesions to anterior abdominal wall.  Evidence of endometriosis on both uterosacral ligaments.   DETAILS OF THE PROCEDURE:  The patient was taken to the operating room,  prepped and draped for combined abdominal and vaginal procedure with legs in  low lithotomy leg support.  Infraumbilical vertical 1 cm skin incision was  made.  There was also a transversae suprapubic incision.  Attention was  directed to the skin where the skin was elevated, Veress needle introduced,  and pneumoperitoneum achieved easily after water droplet test used for  confirmation of position of the needle.  Pneumoperitoneum was followed by  introduction of a 10 mm trocar under direct visualization with inspection of  the pelvis.  A right lower quadrant 5 mm trocar was placed as well as a  suprapubic 11 mm trocar.  Attention was directed to the pelvis.  The cervix  was grasped with a uterine manipulator.  Inspection of the anterior bladder  flap revealed normal tissue structures.  Inspection of the uterus surfaces  itself were grossly within normal limits.  The inspection of the anterior  bladder flap was completely normal.  The ovaries were relatively free except  on the right side, the side  that had had the ovarian cystectomy in the past.  There were some thin, filmy adhesions.  Inspection of the cul-de-sac showed  the cul-de-sac itself and the inner aspects of the uterosacral ligaments  were clear but there was a dense pattern of scarring in the area over the  ureters, particularly on the patient's left side.  We could identify the  ureter and trace it fairly close to the area in question.  It seemed to then  dive deeply and out of the surgical field.  Uterine manipulation resulted in  some traction changes in the peritoneum and this was area was felt that the  peritoneal stretch was perhaps a part of the problem.  We then proceeded to  inspect the tubes.  We identified that no tubes or ovaries showed much  pathology.  There was some agglutination of the fallopian tubes.  There was  no hydrosalpinx.  The photos were taken to identify the left uterosacral  area in particular.  We started to manipulate the areas to see if dissection  was an option but it was extremely vascular and everything we touched wanted  to bleed sufficiently vigorously that we decided against lateral dissection  off of the  ureter.  She was considered a candidate for subsequent Lupron therapy or  Depo-Provera suppression.  At the completion of the investigation and photo  documentation of things we were able to close the procedure by deflating the  abdomen, subcuticular 4-0 Dexon, with staple closure.                                               Tilda Burrow, M.D.    JVF/MEDQ  D:  06/09/2003  T:  06/10/2003  Job:  [id number]

## 2010-07-23 NOTE — Consult Note (Signed)
NAME:  Katherine Davila, Katherine Davila                           ACCOUNT NO.:  [id number]   MEDICAL RECORD NO.:  [id number]                   PATIENT TYPE:  EMS   LOCATION:  ED                                   FACILITY:  [facility]   PHYSICIAN:  Tilda Burrow, M.D.              DATE OF BIRTH:  Jun 14, 1975   DATE OF CONSULTATION:  06/12/2003  DATE OF DISCHARGE:  06/12/2003                                   CONSULTATION   EMERGENCY ROOM VISIT   CHIEF COMPLAINT:  Sudden onset lower abdominal pain 1 week after diagnostic  laparoscopy.   HISTORY OF PRESENT ILLNESS:  A 35 year old female gravida 0 para 0 recently  status post diagnostic laparoscopy for pelvic discomfort, with prior  menstrual period late March or early April who began the sudden onset of  vaginal bleeding associated with some right upper quadrant abdominal  discomfort.  This occurred simultaneously with the patient lifting a small 5-  pound bag up to take it to the bedroom so the patient concerned that she had  torn something loose.  She spoke to someone in our office when the office  called coincidentally to notify her of her office appointment tomorrow  morning.  She was advised if she had concerns that she might need to be seen  in the emergency room.  She sought ambulance nonemergency transport to the  hospital where evaluation by Margarita Grizzle includes urinalysis which is  negative, pelvic exam suggesting normal menses.  The patient's labs have  returned showing hemoglobin 12, hematocrit 37, white count 4900 - all  completely normal.  Potassium 3.4, electrolytes within normal limits.  Urinalysis negative for evidence of urinary tract   On review of system __________ the patient has a history of using Depo-  Provera resulting in hair loss with eventual complete hair regrowth.  She is  vaguely familiar with the diagnosis of endometriosis, some questions were  answered regarding endometriosis and future childbearing plans.  The patient  is  considering attempting to conceive at this time.  She was encouraged to  consider 6 months of suppression of her menses with either continuous oral  contraceptives or Lupron before attempting conception.  We will discuss this  at follow-up.  Follow-up appointment will be rescheduled from June 13, 2003  to June 18, 2003 and follow-up at that time will discuss future treatment  of her endometriosis.  I do not think anything has been torn.  Exam of the  abdomen shows completely-normal, well-healing surgical incision sites.      ___________________________________________                                            Tilda Burrow, M.D.   JVF/MEDQ  D:  06/12/2003  T:  06/13/2003  Job:  [id number]

## 2010-07-23 NOTE — Op Note (Signed)
NAME:  Katherine Davila, Katherine Davila                           ACCOUNT NO.:  [id number]   MEDICAL RECORD NO.:  [id number]                   PATIENT TYPE:  INP   LOCATION:  A303                                 FACILITY:  [facility]   PHYSICIAN:  Lazaro Arms, M.D.                DATE OF BIRTH:  February 03, 1976   DATE OF PROCEDURE:  12/19/2001  DATE OF DISCHARGE:                                 OPERATIVE REPORT   PREOPERATIVE DIAGNOSES:  1. Right lower quadrant pain.  2. Right ovarian cyst.   POSTOPERATIVE DIAGNOSES:  1. Right lower quadrant pain.  2. Right ovarian cyst.   PROCEDURE:  1. Laparoscopic appendectomy by Dirk Dress. Katrinka Blazing, M.D.  2. Laparoscopic right ovarian cystectomy by Lazaro Arms, M.D.   SURGEON:  Lazaro Arms, M.D. and Dirk Dress. Katrinka Blazing, M.D.   ANESTHESIA:  General endotracheal   FINDINGS:  The patient had an approximately 3 cm right ovarian corpus luteum  cyst.  The rest of the right ovary looked normal.  The left ovary looked  normal.  The uterus and the rest of the peritoneal cavity appeared normal.  The appendix was way up almost into the right upper quadrant, under the  cecum as the transverse colon went way up high in the hepatic flexure.  Dr.  Katrinka Blazing performed the appendectomy without difficulty.   DESCRIPTION OF PROCEDURE:  The patient was taken to the operating room in  the supine position and she underwent general endotracheal anesthesia.  She  was placed in the dorsal lithotomy position and prepped and draped in the  usual sterile fashion.  A Foley catheter was placed and an incision made in  the umbilicus.  Note:  A laparoscopy was performed.   Direct access to the peritoneal cavity was gained.  Incision made in the  left lower quadrant after a spinal needle had been placed lateral to the  inferior epigastric vessels and a 12-mm trocar was placed.  Two  fingerbreadths above the pubis another 5-mm trocar was placed.  All of these  were placed under direct visualization.   It took quite a bit of time to find  the appendix.  Dr. Katrinka Blazing did a laparoscopic appendectomy using Endo-GIA and  hemoclips.  It was placed in an EndoCatch bag and removed.   I then turned my attention to the pelvis. I had already seen the right  ovarian cyst and performed a right ovarian cystectomy and removed it again  in an EndoCatch bag.  This was done using the harmonic scalpel under  variable method for hemostasis.  There was no bleeding from the ovary at  all.  The cyst was removed intact.   The pelvis was irrigated vigorously.  Appendectomy site was reviewed. The  right ovarian site was reviewed. The posterior cul-de-sac, uterus, both  tubes and the left ovary otherwise were normal.  Both trocars were taken out  under direct visualization.  There was no bleeding.  The gas was allowed to  escape. The fascia and muscles were reapproximated and the umbilical  incision with 2 sutures.  There was some bleeding of the muscles and the  fascia and muscles were done together to try to keep this from bleeding.  The fascia was closed and the left lower quadrant incision because it was 12-  mm trocar.  Staples were then placed for skin closure in all three sites.  Marcaine 1/2% with 1: 200,000 epinephrine was injected at each site for  postoperative pain control.  The patient was awakened from anesthesia taken  to the recovery room in good stable condition.  All counts were correct.                                               Lazaro Arms, M.D.    Loraine Maple  D:  12/19/2001  T:  12/19/2001  Job:  [id number]

## 2010-07-23 NOTE — Discharge Summary (Signed)
NAME:  Katherine Davila, Katherine Davila                           ACCOUNT NO.:  [id number]   MEDICAL RECORD NO.:  [id number]                  PATIENT TYPE:  IPS   LOCATION:  0404                                 [facility]   PHYSICIAN:  Jeanice Lim, MD                DATE OF BIRTH:  24-Sep-1975   DATE OF ADMISSION:  10/03/2001  DATE OF DISCHARGE:  10/04/2001                                 DISCHARGE SUMMARY   IDENTIFYING DATA:  This is a 35 year old single Caucasian female  involuntarily committed.  The patient had passed out at home and she  threatened she would be found in a body bag the next time, reporting  possible suicidal threats.  The patient had a history of multiple suicide  attempts, numbering close to 54.   MEDICATIONS:  1. Topamax.  2. Seroquel.  3. Vistaril.  4. Trileptal.  5. Effexor.  6. Ambien.  7. Geodon.   LABORATORY DATA:  Routine admission labs were essentially within normal  limits.   MENTAL STATUS EXAM:  The patient was an alert young adult, casually dressed,  partially cooperative, somewhat irritable and resistant at times.  Speech:  Clear.  Mood: Angry, depressed.  Affect: Restricted.  Thought process: Goal  directed.  Thought content: Negative for psychotic symptoms, admitting to  some paranoia, and had not been eating for several days and reported that  she wanted to just end up dead and would go stand under a bridge until she  died.  Cognitive: Intact.  Judgment and insight: Very poor.   ADMISSION DIAGNOSES:   AXIS I:  1. Bipolar disorder, mixed.  2. History of anorexia nervosa.   AXIS II:  Borderline personality disorder.   AXIS III:  Decreased p.o. intake recently; otherwise, none.   AXIS IV:  Moderate problems with primary support group psychosocial issues.   AXIS V:  25/55   HOSPITAL COURSE:  The patient was admitted, ordered routine p.r.n.  medications, underwent further monitoring, and was encouraged to participate  in individual, group,  and milieu therapy.  The patient was initially  reporting suicidal ideation, reporting being angry at her father.  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  The patient was not  cooperative with treatment recommendations and had been recently admitted  several times to [facility] for extended stays and then noncompliant or not  response to the treatment interventions; therefore, longer term placement  was clearly indicated, especially in light of her history of suicide  attempts.  [facility]   CONDITION ON DISCHARGE:  She was discharged in unimproved condition.  Mood  was still depressed, angry, irritable.  Affect: Labile.  Thought processes:  Goal directed.  Thought content: Positive for suicidal ideation, making  other threats.  Cognitive: Cognition was  intact.  Judgment and insight:  Poor.  The patient was discharged to continue previous medications.  She had  been noncompliant with medications since her last discharge.   DISCHARGE MEDICATIONS:  1. Topamax.  2. Seroquel.  3. Trileptal.  4. Effexor.  5. Geodon.  6. Ambien.   FOLLOW UP:  She was to continue inpatient stabilization at the state  hospital for a longer term stay.   DISCHARGE DIAGNOSES:   AXIS I:  1. Bipolar disorder, mixed.  2. History of anorexia nervosa.   AXIS II:  Borderline personality disorder.   AXIS III:  Decreased p.o. intake recently; otherwise, none.   AXIS IV:  Moderate problems with primary support group psychosocial issues.   AXIS V:  Global assessment of functioning on discharge was 30.                                                   Jeanice Lim, MD    JEM/MEDQ  D:  11/12/2001  T:  11/13/2001  Job:  [id number]

## 2010-07-23 NOTE — H&P (Signed)
NAME:  Katherine Davila, Katherine Davila                           ACCOUNT NO.:  [id number]   MEDICAL RECORD NO.:  [id number]                   PATIENT TYPE:  INP   LOCATION:  IC08                                 FACILITY:  [facility]   PHYSICIAN:  Hanley Hays. [place], M.D.           DATE OF BIRTH:  1975-03-24   DATE OF ADMISSION:  12/30/2001  DATE OF DISCHARGE:  12/30/2001                                HISTORY & PHYSICAL   HISTORY OF PRESENT ILLNESS:  The patient is a 35 year old Caucasian female  with a history of bipolar disorder and multiple (upwards of 25) suicide  attempts, history of multiple long-term psychiatric hospitalizations, who  presented to the emergency room after summoning EMS after taking upwards of  40 Serzone tablets and unknown amount of tablets she thinks is diazepam  versus Ambien sometime last evening.  She received charcoal and lavage in  the emergency room.  Her tox screen was positive for benzodiazepines only.  She states she could not sleep and is afraid of the dark, and it is the one-  year anniversary of a miscarriage.  Review of the transcription records  indicates this is not a new problem.  She clinically is stable at this time,  and ACT has been consulted.  She is admitted to the ICU for monitoring,  though there has been no evidence of arrhythmia or any hemodynamic  instability.  She is awake, alert, cooperative.  Neurologic exam is intact.  The remaining physical exam is normal.  She is medically stable for  discharge.   MEDICATIONS:  Unknown doses but include Depakote, Geodon, clonidine, Effexor  XR, Depo-Provera, and question of Ambien.   ALLERGIES:  PENICILLIN, ERYTHROMYCIN.   SOCIAL HISTORY:  She lives with her father and brother.  Her mother is in  [place].  Apparently, she lived with her for awhile as well.  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  No tobacco, alcohol, or drug abuse.   PAST  MEDICAL HISTORY:  1. Bipolar disorder, by her report, five years.  2. History of bulimia and anorexia nervosa according to the record.  3. Spontaneous abortion one year ago.  4. Recent appendectomy.  5. Recent right ovarian cystectomy via laparoscopic surgery per Dr. Despina Hidden,     December 21, 2001.  6. Multiple suicide attempts including drinking Lysol, polysubstance     overdoses, lying on train tracks, etc.   PHYSICAL EXAMINATION:  As noted above.  VITAL SIGNS:  Blood pressure is 113/60, pulse is in the 70s and regular,  respirations unlabored.  GENERAL:  She is clear and prolific.  NEUROLOGIC:  Intact, though clearly her judgment and insight are poor.  EXTREMITIES:  She had some swelling of the left upper extremity secondary to  IV infiltration, but no other findings are noted.  LUNGS:  Clear to auscultation.  LYMPH NODES:  No adenopathy in the inguinal, supraclavicular, or axillary  noted.  BREASTS:  Deferred.  GENITOURINARY:  Deferred.  ABDOMEN:  She has Steri-Strips in the right periumbilical area with some  bruising that is resolving.  Mild tenderness, but unremarkable exam.  SKIN:  There are some small erythematous papules of the left wrist.   LABORATORY DATA:  Essentially normal.  Drug screen has benzodiazepines.  UA  is normal.   ASSESSMENT AND PLAN:  1. Purposeful drug overdose, one of multiple in a complicated psychiatric     patient.  ACT team has been consulted.  She is medically clear.  2. History of rash, apparently at the time of presentation.  She did receive     Solu-Medrol in the emergency room.  There is no evidence of such at this     time.  There is no rash present on her body with the exception of the     left wrist which does not appear to be an allergic-type reaction.  3. Status post recent right laparoscopic ovarian cystectomy and     appendectomy:  Stable from surgical and medical standpoint.                                               Hanley Hays  Josefine Class, M.D.    FED/MEDQ  D:  12/30/2001  T:  12/31/2001  Job:  [id number]

## 2011-03-27 ENCOUNTER — Encounter (HOSPITAL_COMMUNITY): Payer: Self-pay | Admitting: *Deleted

## 2011-03-27 ENCOUNTER — Emergency Department (HOSPITAL_COMMUNITY)
Admission: EM | Admit: 2011-03-27 | Discharge: 2011-03-27 | Disposition: A | Discharge status: Home / Self Care | Payer: Medicare Other | Attending: Emergency Medicine | Admitting: Emergency Medicine

## 2011-03-27 ENCOUNTER — Emergency Department (HOSPITAL_COMMUNITY): Payer: Medicare Other

## 2011-03-27 DIAGNOSIS — H11419 Vascular abnormalities of conjunctiva, unspecified eye: Secondary | ICD-10-CM | POA: Insufficient documentation

## 2011-03-27 DIAGNOSIS — H109 Unspecified conjunctivitis: Secondary | ICD-10-CM | POA: Insufficient documentation

## 2011-03-27 DIAGNOSIS — H5789 Other specified disorders of eye and adnexa: Secondary | ICD-10-CM | POA: Insufficient documentation

## 2011-03-27 MED ORDER — TOBRAMYCIN 0.3 % OP SOLN
2.0000 [drp] | OPHTHALMIC | Status: DC
  Administered 2011-03-27: 2 [drp] via OPHTHALMIC
  Filled 2011-03-27: qty 5

## 2011-03-27 NOTE — ED Notes (Signed)
Pt c/o redness and drainage to her right eye since this am.

## 2011-03-27 NOTE — ED Notes (Signed)
Patient is alert and oriented x 4 with respirations even and unlabored.  NAD at this time.  Discharge instructions reviewed with patient and patient verbalized understanding.  Pt ambulated to lobby with steady gait.  

## 2011-03-27 NOTE — ED Provider Notes (Signed)
History     CSN: [id number]  Arrival date & time 03/27/11  4098   First MD Initiated Contact with Patient 03/27/11 [phone]      Chief Complaint  Patient presents with  . Conjunctivitis    (Consider location/radiation/quality/duration/timing/severity/associated sxs/prior treatment) Patient is a 36 y.o. female presenting with conjunctivitis. The history is provided by the patient.  Conjunctivitis     History reviewed. No pertinent past medical history.  Past Surgical History  Procedure Date  . Appendectomy   . Cesarean section   . Laparoscopic salpingoopherectomy     right    History reviewed. No pertinent family history.  History  Substance Use Topics  . Smoking status: Never Smoker   . Smokeless tobacco: Not on file  . Alcohol Use: No    OB History    Grav Para Term Preterm Abortions TAB SAB Ect Mult Living                  Review of Systems  Allergies  Review of patient's allergies indicates no known allergies.  Home Medications  No current outpatient prescriptions on file.  BP 122/71  Pulse 89  Temp(Src) 97.7 F (36.5 C) (Oral)  Resp 16  Ht 5\' 7"  (1.702 m)  Wt 225 lb (102.059 kg)  BMI 35.24 kg/m2  SpO2 98%  LMP 03/17/2011  Physical Exam  Nursing note and vitals reviewed. Constitutional: She is oriented to person, place, and time. She appears well-developed and well-nourished.  Non-toxic appearance.  HENT:  Head: Normocephalic.  Right Ear: Tympanic membrane and external ear normal.  Left Ear: Tympanic membrane and external ear normal.  Eyes: EOM are normal. Pupils are equal, round, and reactive to light. Right eye exhibits discharge. Right conjunctiva is injected. Left conjunctiva is injected.  Neck: Normal range of motion. Neck supple. Carotid bruit is not present.  Cardiovascular: Normal rate, regular rhythm, normal heart sounds, intact distal pulses and normal pulses.   Pulmonary/Chest: Breath sounds normal. No respiratory distress.    Abdominal: Soft. Bowel sounds are normal. There is no tenderness. There is no guarding.  Musculoskeletal: Normal range of motion.  Lymphadenopathy:       Head (right side): No submandibular adenopathy present.       Head (left side): No submandibular adenopathy present.    She has no cervical adenopathy.  Neurological: She is alert and oriented to person, place, and time. She has normal strength. No cranial nerve deficit or sensory deficit.  Skin: Skin is warm and dry.  Psychiatric: She has a normal mood and affect. Her speech is normal.    ED Course  Procedures (including critical care time)  Labs Reviewed - No data to display No results found.   YN:[id number]   MDM  I have reviewed nursing notes, vital signs, and all appropriate lab and imaging results for this patient. Patient presents with redness and drainage from the right eye on. There is some redness of the left eye. Both eyes are itching and watering. The patient was found to have conjunctivitis. She is advised to use tobramycin. She is advised to wash hands frequently and use cool compresses. Patient will see an eye specialist if not improving.  Kathie Dike, [place] 03/27/11 [phone]

## 2011-03-27 NOTE — ED Provider Notes (Signed)
Medical screening examination/treatment/procedure(s) were performed by non-physician practitioner and as supervising physician I was immediately available for consultation/collaboration. Devoria Albe, MD, Armando Gang   Ward Givens, MD 03/27/11 [phone]

## 2011-08-23 LAB — OB RESULTS CONSOLE RPR: RPR: NONREACTIVE

## 2011-08-23 LAB — OB RESULTS CONSOLE HIV ANTIBODY (ROUTINE TESTING): HIV: NONREACTIVE

## 2011-08-23 LAB — OB RESULTS CONSOLE ABO/RH: RH Type: POSITIVE

## 2011-08-23 LAB — OB RESULTS CONSOLE HEPATITIS B SURFACE ANTIGEN: Hepatitis B Surface Ag: NEGATIVE

## 2011-09-23 ENCOUNTER — Other Ambulatory Visit (HOSPITAL_COMMUNITY)
Admission: RE | Admit: 2011-09-23 | Discharge: 2011-09-23 | Disposition: A | Discharge status: Home / Self Care | Payer: Medicare Other | Source: Ambulatory Visit | Attending: Obstetrics & Gynecology | Admitting: Obstetrics & Gynecology

## 2011-09-23 ENCOUNTER — Other Ambulatory Visit: Payer: Self-pay | Admitting: Obstetrics & Gynecology

## 2011-09-23 DIAGNOSIS — Z113 Encounter for screening for infections with a predominantly sexual mode of transmission: Secondary | ICD-10-CM | POA: Insufficient documentation

## 2011-09-23 DIAGNOSIS — Z01419 Encounter for gynecological examination (general) (routine) without abnormal findings: Secondary | ICD-10-CM | POA: Insufficient documentation

## 2012-04-02 ENCOUNTER — Encounter (HOSPITAL_COMMUNITY): Payer: Self-pay | Admitting: Pharmacist

## 2012-04-02 ENCOUNTER — Encounter (HOSPITAL_COMMUNITY): Payer: Self-pay

## 2012-04-03 ENCOUNTER — Encounter (HOSPITAL_COMMUNITY): Payer: Self-pay

## 2012-04-03 ENCOUNTER — Encounter (HOSPITAL_COMMUNITY)
Admission: RE | Admit: 2012-04-03 | Discharge: 2012-04-03 | Disposition: A | Discharge status: Home / Self Care | Payer: Medicare Other | Source: Ambulatory Visit | Attending: Obstetrics & Gynecology | Admitting: Obstetrics & Gynecology

## 2012-04-03 HISTORY — DX: Other specified health status: Z78.9

## 2012-04-03 HISTORY — DX: Adverse effect of unspecified anesthetic, initial encounter: T41.45XA

## 2012-04-03 LAB — CBC
HCT: 37.4 % (ref 36.0–46.0)
Hemoglobin: 12.5 g/dL (ref 12.0–15.0)
MCHC: 33.4 g/dL (ref 30.0–36.0)
RDW: 14.6 % (ref 11.5–15.5)
WBC: 10.6 10*3/uL — ABNORMAL HIGH (ref 4.0–10.5)

## 2012-04-03 LAB — TYPE AND SCREEN
ABO/RH(D): O POS
Antibody Screen: NEGATIVE

## 2012-04-03 LAB — SURGICAL PCR SCREEN: Staphylococcus aureus: NEGATIVE

## 2012-04-03 NOTE — Patient Instructions (Addendum)
20 Katherine Davila  04/03/2012   Your procedure is scheduled on:  04/06/12  Enter through the Main Entrance of [facility] at 12 Noon  Pick up the phone at the desk and dial [phone].   Call this number if you have problems the morning of surgery: [phone]   Remember:   Do not eat food:After Midnight.  Do not drink clear liquids: 4 Hours before arrival.  Take these medicines the morning of surgery with A SIP OF WATER: NA   Do not wear jewelry, make-up or nail polish.  Do not wear lotions, powders, or perfumes. You may wear deodorant.  Do not shave 48 hours prior to surgery.  Do not bring valuables to the hospital.  Contacts, dentures or bridgework may not be worn into surgery.  Leave suitcase in the car. After surgery it may be brought to your room.  For patients admitted to the hospital, checkout time is 11:00 AM the day of discharge.   Patients discharged the day of surgery will not be allowed to drive home.  Name and phone number of your driver: NA  Special Instructions: Shower using CHG 2 nights before surgery and the night before surgery.  If you shower the day of surgery use CHG.  Use special wash - you have one bottle of CHG for all showers.  You should use approximately 1/3 of the bottle for each shower.   Please read over the following fact sheets that you were given: MRSA Information

## 2012-04-04 LAB — RPR: RPR Ser Ql: NONREACTIVE

## 2012-04-06 ENCOUNTER — Other Ambulatory Visit: Payer: Self-pay | Admitting: Obstetrics & Gynecology

## 2012-04-06 ENCOUNTER — Encounter (HOSPITAL_COMMUNITY)
Admission: RE | Disposition: A | Discharge status: Home / Self Care | Payer: Self-pay | Source: Ambulatory Visit | Attending: Obstetrics & Gynecology

## 2012-04-06 ENCOUNTER — Encounter (HOSPITAL_COMMUNITY): Payer: Self-pay

## 2012-04-06 ENCOUNTER — Inpatient Hospital Stay (HOSPITAL_COMMUNITY): Payer: Medicare Other

## 2012-04-06 ENCOUNTER — Inpatient Hospital Stay (HOSPITAL_COMMUNITY)
Admission: RE | Admit: 2012-04-06 | Discharge: 2012-04-08 | DRG: 766 | Disposition: A | Discharge status: Home / Self Care | Payer: Medicare Other | Source: Ambulatory Visit | Attending: Obstetrics & Gynecology | Admitting: Obstetrics & Gynecology

## 2012-04-06 ENCOUNTER — Encounter (HOSPITAL_COMMUNITY): Payer: Self-pay | Admitting: *Deleted

## 2012-04-06 DIAGNOSIS — O34219 Maternal care for unspecified type scar from previous cesarean delivery: Principal | ICD-10-CM | POA: Diagnosis present

## 2012-04-06 DIAGNOSIS — Z98891 History of uterine scar from previous surgery: Secondary | ICD-10-CM

## 2012-04-06 LAB — PREPARE RBC (CROSSMATCH)

## 2012-04-06 SURGERY — Surgical Case
Anesthesia: Spinal | Site: Abdomen | Wound class: Clean Contaminated

## 2012-04-06 MED ORDER — OXYTOCIN 40 UNITS IN LACTATED RINGERS INFUSION - SIMPLE MED: 62.5000 mL/h | INTRAVENOUS | Status: AC

## 2012-04-06 MED ORDER — DIPHENHYDRAMINE HCL 50 MG/ML IJ SOLN: 25.0000 mg | INTRAMUSCULAR | Status: DC | PRN

## 2012-04-06 MED ORDER — PRENATAL MULTIVITAMIN CH
1.0000 | ORAL_TABLET | Freq: Every day | ORAL | Status: DC
  Administered 2012-04-07 – 2012-04-08 (×2): 1 via ORAL
  Filled 2012-04-06 (×2): qty 1

## 2012-04-06 MED ORDER — KETOROLAC TROMETHAMINE 30 MG/ML IJ SOLN
30.0000 mg | Freq: Four times a day (QID) | INTRAMUSCULAR | Status: AC | PRN
  Administered 2012-04-06: 30 mg via INTRAVENOUS
  Filled 2012-04-06: qty 1

## 2012-04-06 MED ORDER — FENTANYL CITRATE 0.05 MG/ML IJ SOLN
INTRAMUSCULAR | Status: DC | PRN
  Administered 2012-04-06: 12.5 ug via INTRAVENOUS

## 2012-04-06 MED ORDER — PRENATAL MULTIVITAMIN CH: 1.0000 | ORAL_TABLET | Freq: Every day | ORAL | Status: DC

## 2012-04-06 MED ORDER — NALBUPHINE SYRINGE 5 MG/0.5 ML
INJECTION | INTRAMUSCULAR | Status: AC
  Administered 2012-04-06: 10 mg via SUBCUTANEOUS
  Filled 2012-04-06: qty 1

## 2012-04-06 MED ORDER — SIMETHICONE 80 MG PO CHEW
80.0000 mg | CHEWABLE_TABLET | Freq: Three times a day (TID) | ORAL | Status: DC
  Administered 2012-04-07 – 2012-04-08 (×6): 80 mg via ORAL

## 2012-04-06 MED ORDER — KETOROLAC TROMETHAMINE 60 MG/2ML IM SOLN
INTRAMUSCULAR | Status: AC
  Administered 2012-04-06: 60 mg via INTRAMUSCULAR
  Filled 2012-04-06: qty 2

## 2012-04-06 MED ORDER — SCOPOLAMINE 1 MG/3DAYS TD PT72
1.0000 | MEDICATED_PATCH | TRANSDERMAL | Status: DC
  Administered 2012-04-06: 1.5 mg via TRANSDERMAL

## 2012-04-06 MED ORDER — DIBUCAINE 1 % RE OINT: 1.0000 "application " | TOPICAL_OINTMENT | RECTAL | Status: DC | PRN

## 2012-04-06 MED ORDER — ONDANSETRON HCL 4 MG PO TABS
4.0000 mg | ORAL_TABLET | ORAL | Status: DC | PRN
  Administered 2012-04-07: 4 mg via ORAL
  Filled 2012-04-06: qty 1

## 2012-04-06 MED ORDER — LACTATED RINGERS IV SOLN
INTRAVENOUS | Status: DC
Start: 2012-04-06 — End: 2012-04-08
  Administered 2012-04-07 (×2): via INTRAVENOUS

## 2012-04-06 MED ORDER — NALBUPHINE HCL 10 MG/ML IJ SOLN
5.0000 mg | INTRAMUSCULAR | Status: DC | PRN
  Administered 2012-04-06: 5 mg via INTRAVENOUS
  Filled 2012-04-06 (×2): qty 1

## 2012-04-06 MED ORDER — PROMETHAZINE HCL 25 MG/ML IJ SOLN: 6.2500 mg | INTRAMUSCULAR | Status: DC | PRN

## 2012-04-06 MED ORDER — FENTANYL CITRATE 0.05 MG/ML IJ SOLN
INTRAMUSCULAR | Status: AC
  Filled 2012-04-06: qty 2

## 2012-04-06 MED ORDER — NALOXONE HCL 1 MG/ML IJ SOLN: 1.0000 ug/kg/h | INTRAVENOUS | Status: DC | PRN

## 2012-04-06 MED ORDER — WITCH HAZEL-GLYCERIN EX PADS: 1.0000 "application " | MEDICATED_PAD | CUTANEOUS | Status: DC | PRN

## 2012-04-06 MED ORDER — MORPHINE SULFATE 0.5 MG/ML IJ SOLN
INTRAMUSCULAR | Status: AC
  Filled 2012-04-06: qty 10

## 2012-04-06 MED ORDER — TETANUS-DIPHTH-ACELL PERTUSSIS 5-2.5-18.5 LF-MCG/0.5 IM SUSP
0.5000 mL | Freq: Once | INTRAMUSCULAR | Status: AC
  Administered 2012-04-07: 0.5 mL via INTRAMUSCULAR
  Filled 2012-04-06: qty 0.5

## 2012-04-06 MED ORDER — OXYTOCIN 10 UNIT/ML IJ SOLN
40.0000 [IU] | INTRAVENOUS | Status: DC | PRN
  Administered 2012-04-06: 40 [IU] via INTRAVENOUS

## 2012-04-06 MED ORDER — ONDANSETRON HCL 4 MG/2ML IJ SOLN
INTRAMUSCULAR | Status: DC | PRN
  Administered 2012-04-06: 4 mg via INTRAVENOUS

## 2012-04-06 MED ORDER — OXYCODONE-ACETAMINOPHEN 5-325 MG PO TABS
1.0000 | ORAL_TABLET | ORAL | Status: DC | PRN
  Administered 2012-04-07 – 2012-04-08 (×3): 1 via ORAL
  Filled 2012-04-06 (×3): qty 1

## 2012-04-06 MED ORDER — SCOPOLAMINE 1 MG/3DAYS TD PT72
MEDICATED_PATCH | TRANSDERMAL | Status: AC
  Administered 2012-04-06: 1.5 mg via TRANSDERMAL
  Filled 2012-04-06: qty 1

## 2012-04-06 MED ORDER — MORPHINE SULFATE (PF) 0.5 MG/ML IJ SOLN
INTRAMUSCULAR | Status: DC | PRN
Start: 2012-04-06 — End: 2012-04-06
  Administered 2012-04-06: .2 mg via EPIDURAL

## 2012-04-06 MED ORDER — SODIUM CHLORIDE 0.9 % IJ SOLN
3.0000 mL | INTRAMUSCULAR | Status: DC | PRN
  Administered 2012-04-07: 3 mL via INTRAVENOUS

## 2012-04-06 MED ORDER — ZOLPIDEM TARTRATE 5 MG PO TABS: 5.0000 mg | ORAL_TABLET | Freq: Every evening | ORAL | Status: DC | PRN

## 2012-04-06 MED ORDER — PHENYLEPHRINE HCL 10 MG/ML IJ SOLN
INTRAMUSCULAR | Status: DC | PRN
  Administered 2012-04-06: 40 ug via INTRAVENOUS
  Administered 2012-04-06 (×4): 80 ug via INTRAVENOUS

## 2012-04-06 MED ORDER — IBUPROFEN 600 MG PO TABS
600.0000 mg | ORAL_TABLET | Freq: Four times a day (QID) | ORAL | Status: DC
  Administered 2012-04-07 – 2012-04-08 (×6): 600 mg via ORAL
  Filled 2012-04-06 (×7): qty 1

## 2012-04-06 MED ORDER — ONDANSETRON HCL 4 MG/2ML IJ SOLN
4.0000 mg | INTRAMUSCULAR | Status: DC | PRN
  Administered 2012-04-06: 4 mg via INTRAVENOUS
  Filled 2012-04-06: qty 2

## 2012-04-06 MED ORDER — MENTHOL 3 MG MT LOZG: 1.0000 | LOZENGE | OROMUCOSAL | Status: DC | PRN

## 2012-04-06 MED ORDER — SENNOSIDES-DOCUSATE SODIUM 8.6-50 MG PO TABS
2.0000 | ORAL_TABLET | Freq: Every day | ORAL | Status: DC
  Administered 2012-04-07: 2 via ORAL

## 2012-04-06 MED ORDER — CEFAZOLIN SODIUM-DEXTROSE 2-3 GM-% IV SOLR
2.0000 g | INTRAVENOUS | Status: AC
  Administered 2012-04-06: 2 g via INTRAVENOUS

## 2012-04-06 MED ORDER — NALBUPHINE HCL 10 MG/ML IJ SOLN
5.0000 mg | INTRAMUSCULAR | Status: DC | PRN
  Administered 2012-04-06: 10 mg via SUBCUTANEOUS
  Filled 2012-04-06: qty 1

## 2012-04-06 MED ORDER — METOCLOPRAMIDE HCL 5 MG/ML IJ SOLN: 10.0000 mg | Freq: Three times a day (TID) | INTRAMUSCULAR | Status: DC | PRN

## 2012-04-06 MED ORDER — MEPERIDINE HCL 25 MG/ML IJ SOLN: 6.2500 mg | INTRAMUSCULAR | Status: DC | PRN

## 2012-04-06 MED ORDER — KETOROLAC TROMETHAMINE 30 MG/ML IJ SOLN: 30.0000 mg | Freq: Four times a day (QID) | INTRAMUSCULAR | Status: AC | PRN

## 2012-04-06 MED ORDER — NALOXONE HCL 0.4 MG/ML IJ SOLN: 0.4000 mg | INTRAMUSCULAR | Status: DC | PRN

## 2012-04-06 MED ORDER — EPHEDRINE 5 MG/ML INJ
INTRAVENOUS | Status: AC
  Filled 2012-04-06: qty 10

## 2012-04-06 MED ORDER — LACTATED RINGERS IV SOLN
INTRAVENOUS | Status: DC
  Administered 2012-04-06 (×5): via INTRAVENOUS

## 2012-04-06 MED ORDER — PHENYLEPHRINE 40 MCG/ML (10ML) SYRINGE FOR IV PUSH (FOR BLOOD PRESSURE SUPPORT)
PREFILLED_SYRINGE | INTRAVENOUS | Status: AC
  Filled 2012-04-06: qty 5

## 2012-04-06 MED ORDER — BUPIVACAINE IN DEXTROSE 0.75-8.25 % IT SOLN
INTRATHECAL | Status: DC | PRN
  Administered 2012-04-06: 1.4 mL via INTRATHECAL

## 2012-04-06 MED ORDER — DIPHENHYDRAMINE HCL 50 MG/ML IJ SOLN: 12.5000 mg | INTRAMUSCULAR | Status: DC | PRN

## 2012-04-06 MED ORDER — ACETAMINOPHEN 10 MG/ML IV SOLN
1000.0000 mg | Freq: Four times a day (QID) | INTRAVENOUS | Status: AC
  Administered 2012-04-07 (×2): 1000 mg via INTRAVENOUS
  Filled 2012-04-06 (×4): qty 100

## 2012-04-06 MED ORDER — KETOROLAC TROMETHAMINE 60 MG/2ML IM SOLN
60.0000 mg | Freq: Once | INTRAMUSCULAR | Status: AC | PRN
  Administered 2012-04-06: 60 mg via INTRAMUSCULAR

## 2012-04-06 MED ORDER — ACETAMINOPHEN 10 MG/ML IV SOLN
1000.0000 mg | Freq: Four times a day (QID) | INTRAVENOUS | Status: AC
  Administered 2012-04-06: 1000 mg via INTRAVENOUS
  Filled 2012-04-06 (×4): qty 100

## 2012-04-06 MED ORDER — LANOLIN HYDROUS EX OINT: 1.0000 "application " | TOPICAL_OINTMENT | CUTANEOUS | Status: DC | PRN

## 2012-04-06 MED ORDER — ONDANSETRON HCL 4 MG/2ML IJ SOLN
INTRAMUSCULAR | Status: AC
  Filled 2012-04-06: qty 2

## 2012-04-06 MED ORDER — DIPHENHYDRAMINE HCL 25 MG PO CAPS: 25.0000 mg | ORAL_CAPSULE | ORAL | Status: DC | PRN

## 2012-04-06 MED ORDER — SIMETHICONE 80 MG PO CHEW: 80.0000 mg | CHEWABLE_TABLET | ORAL | Status: DC | PRN

## 2012-04-06 MED ORDER — ONDANSETRON HCL 4 MG/2ML IJ SOLN: 4.0000 mg | Freq: Three times a day (TID) | INTRAMUSCULAR | Status: DC | PRN

## 2012-04-06 MED ORDER — CEFAZOLIN SODIUM-DEXTROSE 2-3 GM-% IV SOLR
INTRAVENOUS | Status: AC
  Filled 2012-04-06: qty 50

## 2012-04-06 MED ORDER — HYDROMORPHONE HCL PF 1 MG/ML IJ SOLN: 0.2500 mg | INTRAMUSCULAR | Status: DC | PRN

## 2012-04-06 MED ORDER — OXYTOCIN 10 UNIT/ML IJ SOLN
INTRAMUSCULAR | Status: AC
  Filled 2012-04-06: qty 4

## 2012-04-06 MED ORDER — BUPIVACAINE LIPOSOME 1.3 % IJ SUSP
20.0000 mL | Freq: Once | INTRAMUSCULAR | Status: AC
  Administered 2012-04-06: 20 mL
  Filled 2012-04-06: qty 20

## 2012-04-06 MED ORDER — EPHEDRINE SULFATE 50 MG/ML IJ SOLN
INTRAMUSCULAR | Status: DC | PRN
  Administered 2012-04-06 (×2): 5 mg via INTRAVENOUS
  Administered 2012-04-06: 10 mg via INTRAVENOUS

## 2012-04-06 MED ORDER — DIPHENHYDRAMINE HCL 25 MG PO CAPS: 25.0000 mg | ORAL_CAPSULE | Freq: Four times a day (QID) | ORAL | Status: DC | PRN

## 2012-04-06 MED ORDER — LACTATED RINGERS IV SOLN
INTRAVENOUS | Status: DC | PRN
  Administered 2012-04-06: 15:00:00 via INTRAVENOUS

## 2012-04-06 MED ORDER — KETOROLAC TROMETHAMINE 30 MG/ML IJ SOLN: 15.0000 mg | Freq: Once | INTRAMUSCULAR | Status: DC | PRN

## 2012-04-06 SURGICAL SUPPLY — 32 items
CLOTH BEACON ORANGE TIMEOUT ST (SAFETY) ×2 IMPLANT
DERMABOND ADVANCED (GAUZE/BANDAGES/DRESSINGS) ×1
DERMABOND ADVANCED .7 DNX12 (GAUZE/BANDAGES/DRESSINGS) ×1 IMPLANT
DRAPE LG THREE QUARTER DISP (DRAPES) ×2 IMPLANT
DRSG OPSITE POSTOP 4X10 (GAUZE/BANDAGES/DRESSINGS) ×2 IMPLANT
DURAPREP 26ML APPLICATOR (WOUND CARE) ×6 IMPLANT
ELECT REM PT RETURN 9FT ADLT (ELECTROSURGICAL) ×2
ELECTRODE REM PT RTRN 9FT ADLT (ELECTROSURGICAL) ×1 IMPLANT
EXTRACTOR VACUUM BELL STYLE (SUCTIONS) ×2 IMPLANT
GLOVE BIOGEL PI IND STRL 8 (GLOVE) ×2 IMPLANT
GLOVE BIOGEL PI INDICATOR 8 (GLOVE) ×2
GLOVE ECLIPSE 8.0 STRL XLNG CF (GLOVE) ×2 IMPLANT
KIT ABG SYR 3ML LUER SLIP (SYRINGE) ×2 IMPLANT
NEEDLE HYPO 25X5/8 SAFETYGLIDE (NEEDLE) ×2 IMPLANT
NS IRRIG 1000ML POUR BTL (IV SOLUTION) ×2 IMPLANT
PACK C SECTION WH (CUSTOM PROCEDURE TRAY) ×2 IMPLANT
PAD OB MATERNITY 4.3X12.25 (PERSONAL CARE ITEMS) ×2 IMPLANT
RTRCTR C-SECT PINK 25CM LRG (MISCELLANEOUS) IMPLANT
SLEEVE SCD COMPRESS KNEE MED (MISCELLANEOUS) IMPLANT
STAPLER VISISTAT 35W (STAPLE) IMPLANT
SUT CHROMIC 0 CT 1 (SUTURE) ×2 IMPLANT
SUT MNCRL 0 VIOLET CTX 36 (SUTURE) ×2 IMPLANT
SUT MONOCRYL 0 CTX 36 (SUTURE) ×2
SUT PLAIN 2 0 (SUTURE)
SUT PLAIN 2 0 XLH (SUTURE) ×4 IMPLANT
SUT PLAIN ABS 2-0 CT1 27XMFL (SUTURE) IMPLANT
SUT VIC AB 0 CTX 36 (SUTURE) ×1
SUT VIC AB 0 CTX36XBRD ANBCTRL (SUTURE) ×1 IMPLANT
SUT VIC AB 4-0 KS 27 (SUTURE) IMPLANT
TOWEL OR 17X24 6PK STRL BLUE (TOWEL DISPOSABLE) ×6 IMPLANT
TRAY FOLEY CATH 14FR (SET/KITS/TRAYS/PACK) ×2 IMPLANT
WATER STERILE IRR 1000ML POUR (IV SOLUTION) IMPLANT

## 2012-04-06 NOTE — Addendum Note (Signed)
Addendum  created 04/06/12 1947 by Dana Allan, MD   Modules edited:Orders, PRL Based Order Sets

## 2012-04-06 NOTE — Anesthesia Postprocedure Evaluation (Signed)
Anesthesia Post Note  Patient: Katherine Davila  Procedure(s) Performed: Procedure(s) (LRB): CESAREAN SECTION (N/A)  Anesthesia type: Spinal  Patient location: PACU  Post pain: Pain level controlled  Post assessment: Post-op Vital signs reviewed  Last Vitals:  Filed Vitals:   04/06/12 1615  BP: 123/69  Pulse: 92  Temp:   Resp: 20    Post vital signs: Reviewed  Level of consciousness: awake  Complications: No apparent anesthesia complications

## 2012-04-06 NOTE — H&P (Signed)
Katherine Davila is a 37 y.o. female presenting for repeat Caesarean section at Estimated Date of Delivery 04/12/2012 , by Last Menstrual Period and early 1st trimester confirmatory sonogram now at 39 1/[redacted] weeks gestation.  Prenatal course has been unremarkable.  Declines Trial of labor  . History OB History    Grav Para Term Preterm Abortions TAB SAB Ect Mult Living   3 1   1     1      Past Medical History  Diagnosis Date  . Complication of anesthesia 2011    two attempts at spinal with last preg  . No pertinent past medical history    Past Surgical History  Procedure Date  . Appendectomy   . Cesarean section   . Laparoscopic salpingoopherectomy     right   Family History: family history is not on file. Social History:  reports that she has never smoked. She does not have any smokeless tobacco history on file. She reports that she does not drink alcohol or use illicit drugs.   Prenatal Transfer Tool  Maternal Diabetes: No Genetic Screening: Normal Maternal Ultrasounds/Referrals: Normal Fetal Ultrasounds or other Referrals:  None Maternal Substance Abuse:  No Significant Maternal Medications:  None Significant Maternal Lab Results:  None Other Comments:  None  ROS  Review of Systems  Constitutional: Negative for fever, chills, weight loss, malaise/fatigue and diaphoresis.  HENT: Negative for hearing loss, ear pain, nosebleeds, congestion, sore throat, neck pain, tinnitus and ear discharge.   Eyes: Negative for blurred vision, double vision, photophobia, pain, discharge and redness.  Respiratory: Negative for cough, hemoptysis, sputum production, shortness of breath, wheezing and stridor.   Cardiovascular: Negative for chest pain, palpitations, orthopnea, claudication, leg swelling and PND.  Gastrointestinal: Positive for abdominal pain. Negative for heartburn, nausea, vomiting, diarrhea, constipation, blood in stool and melena.  Genitourinary: Negative for dysuria, urgency,  frequency, hematuria and flank pain.  Musculoskeletal: Negative for myalgias, back pain, joint pain and falls.  Skin: Negative for itching and rash.  Neurological: Negative for dizziness, tingling, tremors, sensory change, speech change, focal weakness, seizures, loss of consciousness, weakness and headaches.  Endo/Heme/Allergies: Negative for environmental allergies and polydipsia. Does not bruise/bleed easily.  Psychiatric/Behavioral: Negative for depression, suicidal ideas, hallucinations, memory loss and substance abuse. The patient is not nervous/anxious and does not have insomnia.        Exam Physical Exam  Physical Exam  Vitals reviewed. Constitutional: She is oriented to person, place, and time. She appears well-developed and well-nourished.  HENT:  Head: Normocephalic and atraumatic.  Right Ear: External ear normal.  Left Ear: External ear normal.  Nose: Nose normal.  Mouth/Throat: Oropharynx is clear and moist.  Eyes: Conjunctivae and EOM are normal. Pupils are equal, round, and reactive to light. Right eye exhibits no discharge. Left eye exhibits no discharge. No scleral icterus.  Neck: Normal range of motion. Neck supple. No tracheal deviation present. No thyromegaly present.  Cardiovascular: Normal rate, regular rhythm, normal heart sounds and intact distal pulses.  Exam reveals no gallop and no friction rub.   No murmur heard. Respiratory: Effort normal and breath sounds normal. No respiratory distress. She has no wheezes. She has no rales. She exhibits no tenderness.  GI: Soft. Bowel sounds are normal. She exhibits no distension and no mass. There is tenderness. There is no rebound and no guarding.  Genitourinary:       Vulva is normal without lesions Vagina is pink moist without discharge Cervix normal in appearance and  pap is normal Uterus is enlarged to tem size Adnexa is negative with normal sized ovaries by sonogram  Musculoskeletal: Normal range of motion. She  exhibits no edema and no tenderness.  Neurological: She is alert and oriented to person, place, and time. She has normal reflexes. She displays normal reflexes. No cranial nerve deficit. She exhibits normal muscle tone. Coordination normal.  Skin: Skin is warm and dry. No rash noted. No erythema. No pallor.  Psychiatric: She has a normal mood and affect. Her behavior is normal. Judgment and thought content normal.    Prenatal labs: ABO, Rh: --/--/O POS, O POS (01/28 1500) Antibody: NEG (01/28 1500) Rubella:   RPR: NON REACTIVE (01/28 1500)  HBsAg: Negative (06/18 0000)  HIV: Non-reactive (06/18 0000)  GBS:   negative  Assessment/Plan: 1. 39 1/[redacted] weeks gestation  2.  Previous Caesarean section 3.  Declines repeat  Patient understands the risks and benefits of the surgery and will proceed.   EURE,LUTHER H 04/06/2012, 11:11 AM

## 2012-04-06 NOTE — Anesthesia Postprocedure Evaluation (Deleted)
Anesthesia Post Note  Patient: Katherine Davila  Procedure(s) Performed: Procedure(s) (LRB): CESAREAN SECTION (N/A)  Anesthesia type: Spinal  Patient location: PACU  Post pain: Pain level controlled  Post assessment: Post-op Vital signs reviewed  Last Vitals: There were no vitals filed for this visit.  Post vital signs: Reviewed  Level of consciousness: awake  Complications: No apparent anesthesia complications

## 2012-04-06 NOTE — Transfer of Care (Signed)
Immediate Anesthesia Transfer of Care Note  Patient: Katherine Davila  Procedure(s) Performed: Procedure(s) (LRB) with comments: CESAREAN SECTION (N/A) - prev c/s  Patient Location: PACU  Anesthesia Type:Spinal  Level of Consciousness: awake, alert , oriented and patient cooperative  Airway & Oxygen Therapy: Patient Spontanous Breathing  Post-op Assessment: Report given to PACU RN and Post -op Vital signs reviewed and stable  Post vital signs: Reviewed and stable  Complications: No apparent anesthesia complications

## 2012-04-06 NOTE — Op Note (Signed)
Preoperative diagnosis:  1.  Intrauterine pregnancy at 58 1/[redacted] weeks gestation                                         2.  {revious Caesarean section                                         3.  Declines TOL   Postoperative diagnosis:  Same as above  Procedure:  Repeat cesarean section  Surgeon:  Lazaro Arms MD  Assistant:    Anesthesia: Spinal  Findings:  Female Apgars 8/9 weight pending.    Over a low transverse incision was delivered a viable female with Apgars of 8 and 9 weighing  lbs.  oz. Uterus, tubes and ovaries were all normal.  There were no other significant findings  Description of operation:  Patient was taken to the operating room and placed in the sitting position where she underwent a spinal anesthetic. She was then placed in the supine position with tilt to the left side. When adequate anesthetic level was obtained she was prepped and draped in usual sterile fashion and a Foley catheter was placed. A Pfannenstiel skin incision was made and carried down sharply to the rectus fascia which was scored in the midline extended laterally. The fascia was taken off the muscles both superiorly and without difficulty. The muscles were divided.  The peritoneal cavity was entered.  Bladder blade was placed, no bladder flap was created.  A low transverse hysterotomy incision was made and delivered a viable female  infant at  with Apgars of 8 and 9 weighing lbs  oz.  Cord pH was obtained and was pending. The uterus was exteriorized. It was closed in 2 layers, the first being a running interlocking layer and the second being an imbricating layer using 0 monocryl on a CTX needle. There was good resulting hemostasis. The uterus tubes and ovaries were all normal. Peritoneal cavity was irrigated vigorously. The muscles and peritoneum were reapproximated loosely. The fascia was closed using 0 Vicryl in running fashion. Subcutaneous tissue was made hemostatic and irrigated. The skin was closed using 4-0  Vicryl on a Keith needle in a subcuticular fashion.  Dermabond was placed for additional wound integrity and to serve as a barrier. Blood loss for the procedure was 650 cc. The patient received a gram of Ancef prophylactically. The patient was taken to the recovery room in good stable condition with all counts being correct x3.  Exparel 266mg  was injected subfascially and subcutaneously  EBL 650 cc  Sandralee Tarkington H 04/06/2012 4:02 PM

## 2012-04-06 NOTE — Anesthesia Preprocedure Evaluation (Signed)
Anesthesia Evaluation  Patient identified by MRN, date of birth, ID band Patient awake    Reviewed: Allergy & Precautions, H&P , NPO status , Patient's Chart, lab work & pertinent test results  Airway Mallampati: II TM Distance: >3 FB Neck ROM: full    Dental No notable dental hx. (+) Teeth Intact   Pulmonary neg pulmonary ROS,    Pulmonary exam normal       Cardiovascular negative cardio ROS      Neuro/Psych negative neurological ROS  negative psych ROS   GI/Hepatic negative GI ROS, Neg liver ROS,   Endo/Other  Morbid obesity  Renal/GU negative Renal ROS  negative genitourinary   Musculoskeletal negative musculoskeletal ROS (+)   Abdominal (+) + obese,   Peds negative pediatric ROS (+)  Hematology negative hematology ROS (+)   Anesthesia Other Findings   Reproductive/Obstetrics (+) Pregnancy                           Anesthesia Physical Anesthesia Plan  ASA: III  Anesthesia Plan: Spinal   Post-op Pain Management:    Induction:   Airway Management Planned:   Additional Equipment:   Intra-op Plan:   Post-operative Plan:   Informed Consent: I have reviewed the patients History and Physical, chart, labs and discussed the procedure including the risks, benefits and alternatives for the proposed anesthesia with the patient or authorized representative who has indicated his/her understanding and acceptance.     Plan Discussed with: CRNA and Surgeon  Anesthesia Plan Comments:         Anesthesia Quick Evaluation

## 2012-04-07 LAB — CBC
MCHC: 32.8 g/dL (ref 30.0–36.0)
Platelets: 239 10*3/uL (ref 150–400)
RDW: 15 % (ref 11.5–15.5)

## 2012-04-07 NOTE — Anesthesia Postprocedure Evaluation (Signed)
  Anesthesia Post-op Note  Patient: Katherine Davila  Procedure(s) Performed: Procedure(s) (LRB) with comments: CESAREAN SECTION (N/A) - prev c/s  Patient Location: Mother/Baby  Anesthesia Type:Spinal  Level of Consciousness: awake, alert  and oriented  Airway and Oxygen Therapy: Patient Spontanous Breathing  Post-op Pain: none  Post-op Assessment: Post-op Vital signs reviewed, Patient's Cardiovascular Status Stable, No headache, No backache, No residual numbness and No residual motor weakness  Post-op Vital Signs: Reviewed and stable  Complications: No apparent anesthesia complications

## 2012-04-07 NOTE — Progress Notes (Signed)
Subjective: Postpartum Day 1: Cesarean Delivery Patient reports tolerating PO, + flatus and no problems voiding.    Objective: Vital signs in last 24 hours: Temp:  [97.7 F (36.5 C)-98.8 F (37.1 C)] 98.8 F (37.1 C) (02/01 0643) Pulse Rate:  [76-92] 82  (02/01 0643) Resp:  [16-21] 18  (02/01 0643) BP: (91-143)/(51-88) 107/67 mmHg (02/01 0643) SpO2:  [95 %-100 %] 95 % (02/01 0643) Weight:  [118.389 kg (261 lb)] 118.389 kg (261 lb) (01/31 1900)  Physical Exam:  General: alert, cooperative and no distress Lochia: appropriate, small amount Uterine Fundus: firm, -1 Incision: no significant drainage, honeycomb dressing in place, slight serosanguinous drainage on left side of dresing DVT Evaluation: No evidence of DVT seen on physical exam. Negative Homan's sign. No cords or calf tenderness. No significant calf/ankle edema.   Basename 04/07/12 0510  HGB 10.4*  HCT 31.7*    Assessment/Plan: Status post Cesarean section. Doing well postoperatively.  Continue current care. Plan for D/C tomorrow.  LEFTWICH-KIRBY, Sahira Cataldi 04/07/2012, 9:10 AM

## 2012-04-07 NOTE — Addendum Note (Signed)
Addendum  created 04/07/12 4098 by Shanon Payor, CRNA   Modules edited:Notes Section

## 2012-04-08 DIAGNOSIS — Z98891 History of uterine scar from previous surgery: Secondary | ICD-10-CM

## 2012-04-08 LAB — BIRTH TISSUE RECOVERY COLLECTION (PLACENTA DONATION)

## 2012-04-08 MED ORDER — OXYCODONE-ACETAMINOPHEN 5-325 MG PO TABS: 1.0000 | ORAL_TABLET | ORAL | Status: DC | PRN

## 2012-04-08 MED ORDER — IBUPROFEN 600 MG PO TABS: 600.0000 mg | ORAL_TABLET | Freq: Four times a day (QID) | ORAL | Status: DC

## 2012-04-08 NOTE — Discharge Summary (Signed)
Obstetric Discharge Summary Reason for Admission: cesarean section repeat elective Prenatal Procedures: none Intrapartum Procedures: cesarean: low cervical, transverse Postpartum Procedures: none Complications-Operative and Postpartum: none Admitted by Dr. Despina Hidden for elective repeat C/S patient declined TOLAC Past Medical History  Diagnosis Date  . Complication of anesthesia 2011    two attempts at spinal with last preg  . No pertinent past medical history    Past Surgical History  Procedure Date  . Appendectomy   . Cesarean section   . Laparoscopic salpingoopherectomy     right   No Known Allergies  Hemoglobin  Date Value Range Status  04/07/2012 10.4* 12.0 - 15.0 g/dL Final     HCT  Date Value Range Status  04/07/2012 31.7* 36.0 - 46.0 % Final    Physical Exam:  General: alert, cooperative and no distress Lochia: appropriate Uterine Fundus: firm Incision: healing well, no significant drainage DVT Evaluation: No evidence of DVT seen on physical exam.  Discharge Diagnoses: Term Pregnancy-delivered  Discharge Information: Date: 04/08/2012 Activity: pelvic rest and no heavy lifting Diet: routine Medications: Ibuprofen and Percocet Condition: stable Instructions: refer to practice specific booklet Discharge to: home   Newborn Data: Live born female  Birth Weight: 10 lb 1 oz (4564 g) APGAR: 9, 10  Home with mother. F/u 6 wks Dr. Despina Hidden Katherine Davila 04/08/2012, 7:16 AM

## 2012-04-09 ENCOUNTER — Encounter (HOSPITAL_COMMUNITY): Payer: Self-pay | Admitting: Obstetrics & Gynecology

## 2012-04-09 NOTE — Progress Notes (Signed)
Post discharge chart review completed.  

## 2012-04-10 LAB — TYPE AND SCREEN
Antibody Screen: NEGATIVE
Unit division: 0

## 2012-04-11 ENCOUNTER — Telehealth (HOSPITAL_COMMUNITY): Payer: Self-pay | Admitting: *Deleted

## 2012-04-11 NOTE — Telephone Encounter (Signed)
Resolve episode 

## 2012-07-26 ENCOUNTER — Telehealth: Payer: Self-pay | Admitting: Advanced Practice Midwife

## 2012-07-26 NOTE — Telephone Encounter (Signed)
Pt called to get an appt, but soonest was 2 weeks away.  She c/o feeling depressed and wants to get back on her medicine.  Denies suicidal/homocidal thoughts.She has been on multiple meds in the past, and feels like Wellbutrin worked well, although she may have had to add another med.  She is currently breastfeeding.  Will Rx Wellbutrin XL 150mg  q am.  F/U with me in 4 weeks.

## 2012-08-15 ENCOUNTER — Encounter: Payer: Self-pay | Admitting: *Deleted

## 2012-08-16 ENCOUNTER — Ambulatory Visit: Payer: Self-pay | Admitting: Advanced Practice Midwife

## 2012-09-26 ENCOUNTER — Telehealth: Payer: Self-pay | Admitting: Obstetrics & Gynecology

## 2012-09-26 NOTE — Telephone Encounter (Signed)
Spoke with pt. She is unsure if she got T Dap in the hospital after delivery. I advised to call [facility] and ask for medical records. Pt is starting a new job in August and needs to know if she got it. Pt voiced understanding. JSY

## 2012-11-28 ENCOUNTER — Ambulatory Visit (INDEPENDENT_AMBULATORY_CARE_PROVIDER_SITE_OTHER): Payer: Medicare Other | Admitting: Family Medicine

## 2012-11-28 ENCOUNTER — Encounter: Payer: Self-pay | Admitting: Family Medicine

## 2012-11-28 VITALS — BP 118/90 | Ht 68.0 in | Wt 242.0 lb

## 2012-11-28 DIAGNOSIS — R42 Dizziness and giddiness: Secondary | ICD-10-CM

## 2012-11-28 DIAGNOSIS — R5381 Other malaise: Secondary | ICD-10-CM

## 2012-11-28 DIAGNOSIS — R632 Polyphagia: Secondary | ICD-10-CM

## 2012-11-28 NOTE — Progress Notes (Signed)
  Subjective:    Patient ID: Katherine Davila, female    DOB: 1975-11-10, 37 y.o.   MRN: [id number]  HPI Patient is here today b/c she states she feels dizzy, tired, and light headed after she eats lunch (around 1:30pm).  This is been going on over the past few days. She notices that every early afternoon after about an hour after eating lunch she is restricting her calories trying to lose weight she is also needing mom is breast-feeding. Mom works a very busy job. Often it interrupts help feedings in the evenings go. On the day she doesn't work she does not have this problem. No passing out spells with it. This is a new patient.  She's never had this problem before. She does have family history diabetes and is concerned about that PMH benign family history benign   Review of Systems See above, she denies fevers headaches sweats nausea diarrhea.    Objective:   Physical Exam Lungs are clear heart is regular pulses normal abdomen soft blood pressure recheck 118/78 extremities no edema skin warm dry neurologic grossly normal EOMI. Finger to nose normal. Romberg negative.       Assessment & Plan:  Near syncope-there is no findings of any type of major issue going on here I think that this is dizziness associated with probable drop in glucose I encourage her to get more complex carbohydrates into the diet possibly work and a snack or 2 during the day and make sure that her calories don't get below 1500 calories. She will do some screening lab work and followup with [place] if ongoing troubles.

## 2013-02-16 ENCOUNTER — Emergency Department (INDEPENDENT_AMBULATORY_CARE_PROVIDER_SITE_OTHER)
Admission: EM | Admit: 2013-02-16 | Discharge: 2013-02-16 | Disposition: A | Discharge status: Home / Self Care | Payer: PRIVATE HEALTH INSURANCE | Source: Home / Self Care | Attending: Family Medicine | Admitting: Family Medicine

## 2013-02-16 ENCOUNTER — Encounter (HOSPITAL_COMMUNITY): Payer: Self-pay | Admitting: Emergency Medicine

## 2013-02-16 DIAGNOSIS — J111 Influenza due to unidentified influenza virus with other respiratory manifestations: Secondary | ICD-10-CM

## 2013-02-16 LAB — POCT RAPID STREP A: Streptococcus, Group A Screen (Direct): NEGATIVE

## 2013-02-16 MED ORDER — OSELTAMIVIR PHOSPHATE 75 MG PO CAPS: 75.0000 mg | ORAL_CAPSULE | Freq: Two times a day (BID) | ORAL | Status: DC

## 2013-02-16 NOTE — ED Provider Notes (Signed)
CSN: [id number]     Arrival date & time 02/16/13  1000 History   First MD Initiated Contact with Patient 02/16/13 1056     Chief Complaint  Patient presents with  . URI   (Consider location/radiation/quality/duration/timing/severity/associated sxs/prior Treatment) Patient is a 37 y.o. female presenting with URI. The history is provided by the patient.  URI Presenting symptoms: congestion, cough, fatigue, fever, rhinorrhea and sore throat   Severity:  Mild Onset quality:  Sudden Duration:  2 days Progression:  Unchanged Chronicity:  New Ineffective treatments:  OTC medications Associated symptoms: myalgias   Risk factors: sick contacts     Past Medical History  Diagnosis Date  . Complication of anesthesia 2011    two attempts at spinal with last preg  . No pertinent past medical history   . Asthma   . Abnormal Pap smear   . Hx of migraines    Past Surgical History  Procedure Laterality Date  . Appendectomy    . Cesarean section    . Laparoscopic salpingoopherectomy      right  . Cesarean section  04/06/2012    Procedure: CESAREAN SECTION;  Surgeon: Lazaro Arms, MD;  Location: [facility];  Service: Obstetrics;  Laterality: N/A;  prev c/s  . Wisdom tooth extraction     Family History  Problem Relation Age of Onset  . Hypertension Other   . Heart disease Other     CAD   History  Substance Use Topics  . Smoking status: Never Smoker   . Smokeless tobacco: Not on file  . Alcohol Use: No   OB History   Grav Para Term Preterm Abortions TAB SAB Ect Mult Living   3 2 1  1     2      Review of Systems  Constitutional: Positive for fever and fatigue.  HENT: Positive for congestion, postnasal drip, rhinorrhea, sore throat and voice change.   Respiratory: Positive for cough.   Musculoskeletal: Positive for myalgias.    Allergies  Erythromycin and Penicillins  Home Medications   Current Outpatient Rx  Name  Route  Sig  Dispense  Refill  . HEATHER 0.35 MG tablet                 BP 122/84  Pulse 70  Temp(Src) 98 F (36.7 C) (Oral)  Resp 18  SpO2 98%  LMP 01/29/2013 Physical Exam  Nursing note and vitals reviewed. Constitutional: She is oriented to person, place, and time. She appears well-developed and well-nourished.  HENT:  Right Ear: External ear normal.  Left Ear: External ear normal.  Mouth/Throat: Oropharynx is clear and moist.  Eyes: Conjunctivae and EOM are normal. Pupils are equal, round, and reactive to light.  Neck: Normal range of motion. Neck supple.  Cardiovascular: Regular rhythm.   Pulmonary/Chest: Effort normal and breath sounds normal.  Lymphadenopathy:    She has no cervical adenopathy.  Neurological: She is alert and oriented to person, place, and time.  Skin: Skin is warm and dry.    ED Course  Procedures (including critical care time) Labs Review Labs Reviewed - No data to display Imaging Review No results found.  EKG Interpretation    Date/Time:    Ventricular Rate:    PR Interval:    QRS Duration:   QT Interval:    QTC Calculation:   R Axis:     Text Interpretation:              MDM  Linna Hoff, MD 02/16/13 [phone]

## 2013-02-16 NOTE — ED Notes (Signed)
Pt c/o cold sxs onset Thursday Sxs include: fevers, BA, chills, ST, congestion Denies: v/n/d, SOB, wheezing Taking OTC cold meds w/no relief Alert w/no signs of acute distress.

## 2013-05-30 ENCOUNTER — Other Ambulatory Visit: Payer: Self-pay | Admitting: Obstetrics & Gynecology

## 2013-08-22 ENCOUNTER — Telehealth: Payer: Self-pay | Admitting: Obstetrics & Gynecology

## 2013-08-22 NOTE — Telephone Encounter (Signed)
PT states been off her birth control pill since February and would like to restart them. Call transferred to front staff for an appt to be made.

## 2013-08-26 ENCOUNTER — Encounter: Payer: Self-pay | Admitting: Adult Health

## 2013-08-26 ENCOUNTER — Ambulatory Visit (INDEPENDENT_AMBULATORY_CARE_PROVIDER_SITE_OTHER): Payer: PRIVATE HEALTH INSURANCE | Admitting: Adult Health

## 2013-08-26 VITALS — BP 110/90 | Ht 68.0 in | Wt 230.0 lb

## 2013-08-26 DIAGNOSIS — Z309 Encounter for contraceptive management, unspecified: Secondary | ICD-10-CM

## 2013-08-26 DIAGNOSIS — Z3009 Encounter for other general counseling and advice on contraception: Secondary | ICD-10-CM

## 2013-08-26 DIAGNOSIS — Z13220 Encounter for screening for lipoid disorders: Secondary | ICD-10-CM | POA: Insufficient documentation

## 2013-08-26 DIAGNOSIS — Z30011 Encounter for initial prescription of contraceptive pills: Secondary | ICD-10-CM

## 2013-08-26 HISTORY — DX: Encounter for contraceptive management, unspecified: Z30.9

## 2013-08-26 LAB — POCT URINE PREGNANCY: PREG TEST UR: NEGATIVE

## 2013-08-26 MED ORDER — NORETHINDRONE 0.35 MG PO TABS: ORAL_TABLET | ORAL | Status: DC

## 2013-08-26 NOTE — Progress Notes (Signed)
Subjective:     Patient ID: Katherine Davila, female   DOB: [birth year], 38 y.o.   MRN: [id number]  HPI Katherine Davila is a 38 year old white female married in to get back on OCs, was on Katherine Davila and wants to resume that.  Review of Systems See HPI Reviewed past medical,surgical, social and family history. Reviewed medications and allergies.     Objective:   Physical Exam BP 110/90  Ht 5\' 8"  (1.727 m)  Wt 230 lb (104.327 kg)  BMI 34.98 kg/m2  LMP 08/22/2013  Breastfeeding? NoUPT negative Skin warm and dry.  Lungs: clear to ausculation bilaterally. Cardiovascular: regular rate and rhythm.   Discussed the pill wants to go back on what she was on, Katherine Davila.  Assessment:     Contraceptive management    Plan:    Review handout on OC use Refilled Katherine Davila, disp 1 pack take 1 daily, start today, use condom x 4 weeks and refill x 11 Follow up in 3 months for pap and physical and ROS

## 2013-08-26 NOTE — Patient Instructions (Signed)
Oral Contraception Use Oral contraceptive pills (OCPs) are medicines taken to prevent pregnancy. OCPs work by preventing the ovaries from releasing eggs. The hormones in OCPs also cause the cervical mucus to thicken, preventing the sperm from entering the uterus. The hormones also cause the uterine lining to become thin, not allowing a fertilized egg to attach to the inside of the uterus. OCPs are highly effective when taken exactly as prescribed. However, OCPs do not prevent sexually transmitted diseases (STDs). Safe sex practices, such as using condoms along with an OCP, can help prevent STDs. Before taking OCPs, you may have a physical exam and Pap test. Your health care provider may also order blood tests if necessary. Your health care provider will make sure you are a good candidate for oral contraception. Discuss with your health care provider the possible side effects of the OCP you may be prescribed. When starting an OCP, it can take 2 to 3 months for the body to adjust to the changes in hormone levels in your body.  HOW TO TAKE ORAL CONTRACEPTIVE PILLS Your health care provider may advise you on how to start taking the first cycle of OCPs. Otherwise, you can:   Start on day 1 of your menstrual period. You will not need any backup contraceptive protection with this start time.   Start on the first Sunday after your menstrual period or the day you get your prescription. In these cases, you will need to use backup contraceptive protection for the first week.   Start the pill at any time of your cycle. If you take the pill within 5 days of the start of your period, you are protected against pregnancy right away. In this case, you will not need a backup form of birth control. If you start at any other time of your menstrual cycle, you will need to use another form of birth control for 7 days. If your OCP is the type called a minipill, it will protect you from pregnancy after taking it for 2 days (48  hours). After you have started taking OCPs:   If you forget to take 1 pill, take it as soon as you remember. Take the next pill at the regular time.   If you miss 2 or more pills, call your health care provider because different pills have different instructions for missed doses. Use backup birth control until your next menstrual period starts.   If you use a 28-day pack that contains inactive pills and you miss 1 of the last 7 pills (pills with no hormones), it will not matter. Throw away the rest of the nonhormone pills and start a new pill pack.  No matter which day you start the OCP, you will always start a new pack on that same day of the week. Have an extra pack of OCPs and a backup contraceptive method available in case you miss some pills or lose your OCP pack.  HOME CARE INSTRUCTIONS   Do not smoke.   Always use a condom to protect against STDs. OCPs do not protect against STDs.   Use a calendar to mark your menstrual period days.   Read the information and directions that came with your OCP. Talk to your health care provider if you have questions.  SEEK MEDICAL CARE IF:   You develop nausea and vomiting.   You have abnormal vaginal discharge or bleeding.   You develop a rash.   You miss your menstrual period.   You are losing   your hair.   You need treatment for mood swings or depression.   You get dizzy when taking the OCP.   You develop acne from taking the OCP.   You become pregnant.  SEEK IMMEDIATE MEDICAL CARE IF:   You develop chest pain.   You develop shortness of breath.   You have an uncontrolled or severe headache.   You develop numbness or slurred speech.   You develop visual problems.   You develop pain, redness, and swelling in the legs.  Document Released: 02/10/2011 Document Revised: 10/24/2012 Document Reviewed: 08/12/2012 [facility]Exit[facility] Patient Information 2015 [place], [place]. This information is not intended to replace  advice given to you by your health care provider. Make sure you discuss any questions you have with your health care provider. Start OCs today use condoms Follow up in 3 months for pap and physical

## 2013-11-04 ENCOUNTER — Encounter: Payer: Self-pay | Admitting: Adult Health

## 2013-11-04 ENCOUNTER — Ambulatory Visit (INDEPENDENT_AMBULATORY_CARE_PROVIDER_SITE_OTHER): Payer: PRIVATE HEALTH INSURANCE | Admitting: Adult Health

## 2013-11-04 DIAGNOSIS — Z3201 Encounter for pregnancy test, result positive: Secondary | ICD-10-CM

## 2013-11-04 LAB — POCT URINE PREGNANCY: PREG TEST UR: POSITIVE

## 2013-11-04 NOTE — Progress Notes (Signed)
Pt here for pregnancy test. Positive result. Advised can have spotting and cramping during early pregnancy, that's just everything getting settled into place. Pt hasn't noticed either. Advised if she notices any spotting or cramping, push fluids, take it easy, and call office. Pt voiced understanding. JSY

## 2013-11-05 ENCOUNTER — Other Ambulatory Visit: Payer: Self-pay | Admitting: Obstetrics and Gynecology

## 2013-11-05 DIAGNOSIS — O3680X0 Pregnancy with inconclusive fetal viability, not applicable or unspecified: Secondary | ICD-10-CM

## 2013-11-05 DIAGNOSIS — O09529 Supervision of elderly multigravida, unspecified trimester: Secondary | ICD-10-CM

## 2013-11-08 ENCOUNTER — Ambulatory Visit (INDEPENDENT_AMBULATORY_CARE_PROVIDER_SITE_OTHER): Payer: PRIVATE HEALTH INSURANCE

## 2013-11-08 ENCOUNTER — Other Ambulatory Visit: Payer: Self-pay | Admitting: Obstetrics and Gynecology

## 2013-11-08 DIAGNOSIS — O09529 Supervision of elderly multigravida, unspecified trimester: Secondary | ICD-10-CM

## 2013-11-08 DIAGNOSIS — O3680X0 Pregnancy with inconclusive fetal viability, not applicable or unspecified: Secondary | ICD-10-CM

## 2013-11-08 DIAGNOSIS — O26849 Uterine size-date discrepancy, unspecified trimester: Secondary | ICD-10-CM

## 2013-11-08 NOTE — Progress Notes (Signed)
U/S-single IUP with FCA noted, FHR-110 bpm, CRL c/w 5+6wks EDD 07/05/2014, cx appears closed, bilateral adnexa appears WNL

## 2013-11-19 ENCOUNTER — Encounter: Payer: PRIVATE HEALTH INSURANCE | Admitting: Advanced Practice Midwife

## 2013-11-27 ENCOUNTER — Other Ambulatory Visit: Payer: PRIVATE HEALTH INSURANCE | Admitting: Adult Health

## 2013-11-28 ENCOUNTER — Encounter: Payer: Self-pay | Admitting: Advanced Practice Midwife

## 2013-11-28 ENCOUNTER — Other Ambulatory Visit (HOSPITAL_COMMUNITY)
Admission: RE | Admit: 2013-11-28 | Discharge: 2013-11-28 | Disposition: A | Discharge status: Home / Self Care | Payer: PRIVATE HEALTH INSURANCE | Source: Ambulatory Visit | Attending: Advanced Practice Midwife | Admitting: Advanced Practice Midwife

## 2013-11-28 ENCOUNTER — Ambulatory Visit (INDEPENDENT_AMBULATORY_CARE_PROVIDER_SITE_OTHER): Payer: PRIVATE HEALTH INSURANCE | Admitting: Advanced Practice Midwife

## 2013-11-28 VITALS — BP 100/68 | Wt 233.0 lb

## 2013-11-28 DIAGNOSIS — Z1151 Encounter for screening for human papillomavirus (HPV): Secondary | ICD-10-CM | POA: Diagnosis present

## 2013-11-28 DIAGNOSIS — Z0184 Encounter for antibody response examination: Secondary | ICD-10-CM

## 2013-11-28 DIAGNOSIS — Z0189 Encounter for other specified special examinations: Secondary | ICD-10-CM

## 2013-11-28 DIAGNOSIS — Z124 Encounter for screening for malignant neoplasm of cervix: Secondary | ICD-10-CM | POA: Diagnosis present

## 2013-11-28 DIAGNOSIS — Z331 Pregnant state, incidental: Secondary | ICD-10-CM

## 2013-11-28 DIAGNOSIS — Z3480 Encounter for supervision of other normal pregnancy, unspecified trimester: Secondary | ICD-10-CM

## 2013-11-28 DIAGNOSIS — Z1389 Encounter for screening for other disorder: Secondary | ICD-10-CM

## 2013-11-28 DIAGNOSIS — Z1159 Encounter for screening for other viral diseases: Secondary | ICD-10-CM

## 2013-11-28 DIAGNOSIS — Z113 Encounter for screening for infections with a predominantly sexual mode of transmission: Secondary | ICD-10-CM | POA: Insufficient documentation

## 2013-11-28 DIAGNOSIS — Z3490 Encounter for supervision of normal pregnancy, unspecified, unspecified trimester: Secondary | ICD-10-CM

## 2013-11-28 DIAGNOSIS — Z98891 History of uterine scar from previous surgery: Secondary | ICD-10-CM

## 2013-11-28 DIAGNOSIS — Z1371 Encounter for nonprocreative screening for genetic disease carrier status: Secondary | ICD-10-CM

## 2013-11-28 DIAGNOSIS — Z3481 Encounter for supervision of other normal pregnancy, first trimester: Secondary | ICD-10-CM

## 2013-11-28 NOTE — Progress Notes (Signed)
Subjective:    Katherine Davila is a [id number] [redacted]w[redacted]d being seen today for her first obstetrical visit.  Her obstetrical history is significant fo rC/S  X2 (first one for breech and 2nd one was a repeat for macrosomia: 10#). She is considering TOLAC. Pregnancy history fully reviewed.  Patient reports fatigue and constipation.  Takes omega 3 fatty acids and GI probiotic  Filed Vitals:   11/28/13 1404  BP: 100/68  Weight: 233 lb (105.688 kg)    HISTORY: OB History  Gravida Para Term Preterm AB SAB TAB Ectopic Multiple Living  # Outcome Date GA Lbr Len/2nd Weight Sex Delivery Anes PTL Lv  4 CUR           3 TRM 04/06/12 [redacted]w[redacted]d  10 lb 1 oz (4.564 kg) M LTCS Spinal  Y     Comments: LGA  2 PAR 11/26/11    F CS     1 ABT              Past Medical History  Diagnosis Date  . Complication of anesthesia 2011    two attempts at spinal with last preg  . No pertinent past medical history   . Asthma   . Abnormal Pap smear   . Hx of migraines   . Vaginal Pap smear, abnormal   . Contraceptive management 08/26/2013   Past Surgical History  Procedure Laterality Date  . Appendectomy    . Cesarean section    . Laparoscopic salpingoopherectomy      right  . Cesarean section  04/06/2012    Procedure: CESAREAN SECTION;  Surgeon: Lazaro Arms, MD;  Location: [facility];  Service: Obstetrics;  Laterality: N/A;  prev c/s  . Wisdom tooth extraction     Family History  Problem Relation Age of Onset  . Hypertension Other   . Heart disease Other     CAD  . Cancer Father     head, neck  . Hypertension Father   . Dementia Maternal Grandmother   . Heart disease Maternal Grandmother   . Dementia Paternal Grandmother      Exam       Pelvic Exam:    Perineum: Normal Perineum   Vulva: normal   Vagina:  normal mucosa, normal discharge, no palpable nodules   Uterus Normal, Gravid, FH: ~8  PAP collected     Cervix: normal   Adnexa: Not palpable   Urinary:  urethral meatus  normal    System: Breast:  normal appearance, no masses or tenderness   Skin: normal coloration and turgor, no rashes    Neurologic: oriented, normal, normal mood   Extremities: normal strength, tone, and muscle mass   HEENT PERRLA   Mouth/Teeth mucous membranes moist, normal dentition   Neck supple and no masses   Cardiovascular: regular rate and rhythm   Respiratory:  appears well, vitals normal, no respiratory distress, acyanotic   Abdomen: soft, non-tender;  FHR: 160 [place]          Assessment:    Pregnancy: [id number] Patient Active Problem List   Diagnosis Date Noted  . Contraceptive management 08/26/2013  . H/O: cesarean section 04/08/2012        Plan:     Initial labs drawn. Continue prenatal vitamins  Problem list reviewed and updated  Reviewed n/v relief measures and warning s/s to report  Reviewed recommended weight gain based on pre-gravid BMI  Encouraged  well-balanced diet Genetic Screening discussed Integrated Screen: requested.  Ultrasound discussed; fetal survey: requested.  Follow up in 4 weeks for NT/IT/Low-risk ob appt .  CRESENZO-DISHMAN,Mariska Daffin 11/28/2013

## 2013-11-29 LAB — DRUG SCREEN, URINE, NO CONFIRMATION
Amphetamine Screen, Ur: NEGATIVE
Barbiturate Quant, Ur: NEGATIVE
Benzodiazepines.: NEGATIVE
CREATININE, U: 116.9 mg/dL
Cocaine Metabolites: NEGATIVE
METHADONE: NEGATIVE
Marijuana Metabolite: NEGATIVE
Opiate Screen, Urine: NEGATIVE
Phencyclidine (PCP): NEGATIVE
Propoxyphene: NEGATIVE

## 2013-11-29 LAB — URINALYSIS, ROUTINE W REFLEX MICROSCOPIC
BILIRUBIN URINE: NEGATIVE
Glucose, UA: NEGATIVE mg/dL
Hgb urine dipstick: NEGATIVE
Ketones, ur: NEGATIVE mg/dL
Leukocytes, UA: NEGATIVE
NITRITE: NEGATIVE
PROTEIN: NEGATIVE mg/dL
Specific Gravity, Urine: 1.02 (ref 1.005–1.030)
UROBILINOGEN UA: 0.2 mg/dL (ref 0.0–1.0)
pH: 7 (ref 5.0–8.0)

## 2013-11-29 LAB — TSH: TSH: 0.928 u[IU]/mL (ref 0.350–4.500)

## 2013-11-29 LAB — ANTIBODY SCREEN: ANTIBODY SCREEN: NEGATIVE

## 2013-11-29 LAB — CBC
HEMATOCRIT: 41.7 % (ref 36.0–46.0)
HEMOGLOBIN: 14.3 g/dL (ref 12.0–15.0)
MCH: 31 pg (ref 26.0–34.0)
MCHC: 34.3 g/dL (ref 30.0–36.0)
MCV: 90.5 fL (ref 78.0–100.0)
Platelets: 283 10*3/uL (ref 150–400)
RBC: 4.61 MIL/uL (ref 3.87–5.11)
RDW: 13.8 % (ref 11.5–15.5)
WBC: 8.6 10*3/uL (ref 4.0–10.5)

## 2013-11-29 LAB — VARICELLA ZOSTER ANTIBODY, IGG: VARICELLA IGG: 2067 {index} — AB (ref <135.00)

## 2013-11-29 LAB — HIV ANTIBODY (ROUTINE TESTING W REFLEX): HIV 1&2 Ab, 4th Generation: NONREACTIVE

## 2013-11-29 LAB — RPR

## 2013-11-29 LAB — RUBELLA SCREEN: Rubella: 5.76 Index — ABNORMAL HIGH (ref <0.90)

## 2013-11-29 LAB — OXYCODONE SCREEN, UA, RFLX CONFIRM: Oxycodone Screen, Ur: NEGATIVE ng/mL

## 2013-11-29 LAB — HEPATITIS B SURFACE ANTIGEN: HEP B S AG: NEGATIVE

## 2013-11-30 LAB — URINE CULTURE

## 2013-12-02 LAB — CYTOLOGY - PAP

## 2013-12-10 ENCOUNTER — Encounter: Payer: Self-pay | Admitting: Advanced Practice Midwife

## 2013-12-26 ENCOUNTER — Ambulatory Visit (INDEPENDENT_AMBULATORY_CARE_PROVIDER_SITE_OTHER): Payer: PRIVATE HEALTH INSURANCE | Admitting: Advanced Practice Midwife

## 2013-12-26 ENCOUNTER — Ambulatory Visit (INDEPENDENT_AMBULATORY_CARE_PROVIDER_SITE_OTHER): Payer: PRIVATE HEALTH INSURANCE

## 2013-12-26 ENCOUNTER — Other Ambulatory Visit: Payer: Self-pay | Admitting: Advanced Practice Midwife

## 2013-12-26 ENCOUNTER — Encounter: Payer: Self-pay | Admitting: Advanced Practice Midwife

## 2013-12-26 VITALS — BP 100/66 | Wt 229.0 lb

## 2013-12-26 DIAGNOSIS — O4400 Placenta previa specified as without hemorrhage, unspecified trimester: Secondary | ICD-10-CM | POA: Insufficient documentation

## 2013-12-26 DIAGNOSIS — Z3682 Encounter for antenatal screening for nuchal translucency: Secondary | ICD-10-CM

## 2013-12-26 DIAGNOSIS — Z1389 Encounter for screening for other disorder: Secondary | ICD-10-CM

## 2013-12-26 DIAGNOSIS — Z331 Pregnant state, incidental: Secondary | ICD-10-CM

## 2013-12-26 DIAGNOSIS — O4411 Placenta previa with hemorrhage, first trimester: Secondary | ICD-10-CM

## 2013-12-26 DIAGNOSIS — Z36 Encounter for antenatal screening of mother: Secondary | ICD-10-CM

## 2013-12-26 DIAGNOSIS — O0991 Supervision of high risk pregnancy, unspecified, first trimester: Secondary | ICD-10-CM

## 2013-12-26 DIAGNOSIS — Z3481 Encounter for supervision of other normal pregnancy, first trimester: Secondary | ICD-10-CM

## 2013-12-26 DIAGNOSIS — O4401 Placenta previa specified as without hemorrhage, first trimester: Secondary | ICD-10-CM

## 2013-12-26 LAB — POCT URINALYSIS DIPSTICK
Blood, UA: NEGATIVE
Glucose, UA: NEGATIVE
Ketones, UA: NEGATIVE
Leukocytes, UA: NEGATIVE
Nitrite, UA: NEGATIVE
PROTEIN UA: NEGATIVE

## 2013-12-26 NOTE — Progress Notes (Signed)
U/S(12+5wks)-single IUP with +FCA noted, FHR-166 bpm, CRL c/w dates, cx appears closed (3.6cm), anterior Gr 0 placenta Total Previa noted, NB present, NT-1.479mm

## 2013-12-26 NOTE — Patient Instructions (Addendum)
Placenta Previa  Placenta previa is a condition in pregnant women where the placenta implants in the lower part of the uterus. The placenta either partially or completely covers the opening to the cervix. This is a problem because the baby must pass through the cervix during delivery. There are three types of placenta previa. They include:  1. Marginal placenta previa. The placenta is near the cervix, but does not cover the opening. 2. Partial placenta previa. The placenta covers part of the cervical opening. 3. Complete placenta previa. The placenta covers the entire cervical opening.  Depending on the type of placenta previa, there is a chance the placenta may move into a normal position and no longer cover the cervix as the pregnancy progresses. It is important to keep all prenatal visits with your caregiver.  RISK FACTORS You may be more likely to develop placenta previa if you:   Are carrying more than one baby (multiples).   Have an abnormally shaped uterus.   Have scars on the lining of the uterus.   Had previous surgeries involving the uterus, such as a cesarean delivery.   Have delivered a baby previously.   Have a history of placenta previa.   Have smoked or used cocaine during pregnancy.   Are age 36 or older during pregnancy.  SYMPTOMS The main symptom of placenta previa is sudden, painless vaginal bleeding during the second half of pregnancy. The amount of bleeding can be light to very heavy. The bleeding may stop on its own, but almost always returns. Cramping, regular contractions, abdominal pain, and lower back pain can also occur with placenta previa.  DIAGNOSIS Placenta previa can be diagnosed through an ultrasound by finding where the placenta is located. The ultrasound may find placenta previa either during a routine prenatal visit or after vaginal bleeding is noticed. If you are diagnosed with placenta previa, your caregiver may avoid vaginal exams to reduce  the risk of heavy bleeding. There is a chance that placenta previa may not be diagnosed until bleeding occurs during labor.  TREATMENT Specific treatment depends on:   How much you are bleeding or if the bleeding has stopped.  How far along you are in your pregnancy.   The condition of the baby.   The location of the baby and placenta.   The type of placenta previa.  Depending on the factors above, your caregiver may recommend:   Decreased activity (no heavy lifting)  Pelvic rest. This means no sex, using tampons, douching, pelvic exams, or placing anything into the vagina.  A blood transfusion to replace maternal blood loss.  A cesarean delivery if the bleeding is heavy and cannot be controlled or the placenta completely covers the cervix.  Medication to stop premature labor or mature the fetal lungs if delivery is needed before the pregnancy is full term.  WHEN SHOULD YOU SEEK IMMEDIATE MEDICAL CARE IF YOU ARE SENT HOME WITH PLACENTA PREVIA? Seek immediate medical care if you show any symptoms of placenta previa. You will need to go to the hospital to get checked immediately. Again, those symptoms are:  Sudden, painless vaginal bleeding, even a small amount.  Cramping or regular contractions.  Lower back or abdominal pain. Document Released: 02/21/2005 Document Revised: 10/24/2012 Document Reviewed: 05/25/2012 [facility]ExitCare Patient Information 2015 [place]Care, [place]LL[place]. This information is not intended to replace advice given to you by your health care provider. Make sure you discuss any questions you have with your health care provider.

## 2013-12-26 NOTE — Progress Notes (Signed)
Pt states that she has had an increase in nausea and vomiting in the last week.

## 2013-12-26 NOTE — Progress Notes (Signed)
High Risk Pregnancy Diagnosis(es):   Total placenta previa  [id number] 1062w5d Estimated Date of Delivery: 07/05/14  Blood pressure 100/66, weight 229 lb (103.874 kg), last menstrual period 08/22/2013, not currently breastfeeding.  Urinalysis: Negative   HPI: Here for NT/IT/ob visit.   U/S(12+5wks)-single IUP with +FCA noted, FHR-166 bpm, CRL c/w dates, cx appears closed (3.6cm), anterior Gr 0 placenta Total Previa noted, NB present, NT-1.489mm   BP weight and urine results all reviewed and noted. Patient denies any bleeding and no rupture of membranes symptoms.   Fetal Heart rate:  166 Edema:  no  Patient is without complaints. Discussed placenta previa.   All questions were answered.  Assessment:  7062w5d,   Total placenta previa; otherwise normal us  Medication(s) Plans:  none  Treatment Plan:  Pelvic rest; recheck placenta with 20 week us  Follow up in 4 weeks for OB appt, 2nd IT

## 2013-12-31 LAB — MATERNAL SCREEN, INTEGRATED #1

## 2014-01-06 ENCOUNTER — Encounter: Payer: Self-pay | Admitting: Advanced Practice Midwife

## 2014-01-07 ENCOUNTER — Telehealth: Payer: Self-pay | Admitting: Advanced Practice Midwife

## 2014-01-07 NOTE — Telephone Encounter (Signed)
Pt called stating that she was exposed to shingles at work today, she did not actually touch the blisters but did raise the patients shirt up to look at the rash before putting gloves on.  Advised pt to try to avoid contact as much as possible and if she starts to develop any symptoms such as burning, pain or a rash to let us know.  Pt verbalized understanding.

## 2014-01-21 ENCOUNTER — Encounter: Payer: PRIVATE HEALTH INSURANCE | Admitting: Obstetrics & Gynecology

## 2014-01-22 ENCOUNTER — Encounter: Payer: Self-pay | Admitting: Adult Health

## 2014-01-22 ENCOUNTER — Ambulatory Visit (INDEPENDENT_AMBULATORY_CARE_PROVIDER_SITE_OTHER): Payer: PRIVATE HEALTH INSURANCE | Admitting: Adult Health

## 2014-01-22 VITALS — BP 110/80 | Wt 230.5 lb

## 2014-01-22 DIAGNOSIS — R030 Elevated blood-pressure reading, without diagnosis of hypertension: Secondary | ICD-10-CM

## 2014-01-22 DIAGNOSIS — O0992 Supervision of high risk pregnancy, unspecified, second trimester: Secondary | ICD-10-CM

## 2014-01-22 DIAGNOSIS — Z1389 Encounter for screening for other disorder: Secondary | ICD-10-CM

## 2014-01-22 DIAGNOSIS — IMO0001 Reserved for inherently not codable concepts without codable children: Secondary | ICD-10-CM

## 2014-01-22 DIAGNOSIS — Z331 Pregnant state, incidental: Secondary | ICD-10-CM

## 2014-01-22 LAB — POCT URINALYSIS DIPSTICK
GLUCOSE UA: NEGATIVE
Glucose, UA: NEGATIVE
KETONES UA: NEGATIVE
Ketones, UA: NEGATIVE
Leukocytes, UA: NEGATIVE
NITRITE UA: NEGATIVE
Nitrite, UA: NEGATIVE
Protein, UA: NEGATIVE
RBC UA: NEGATIVE
RBC UA: NEGATIVE

## 2014-01-22 LAB — CBC
HEMATOCRIT: 38.7 % (ref 36.0–46.0)
HEMOGLOBIN: 13.2 g/dL (ref 12.0–15.0)
MCH: 31.6 pg (ref 26.0–34.0)
MCHC: 34.1 g/dL (ref 30.0–36.0)
MCV: 92.6 fL (ref 78.0–100.0)
MPV: 10.5 fL (ref 9.4–12.4)
Platelets: 222 10*3/uL (ref 150–400)
RBC: 4.18 MIL/uL (ref 3.87–5.11)
RDW: 14.1 % (ref 11.5–15.5)
WBC: 8.8 10*3/uL (ref 4.0–10.5)

## 2014-01-22 LAB — COMPREHENSIVE METABOLIC PANEL
ALT: 8 U/L (ref 0–35)
AST: 10 U/L (ref 0–37)
Albumin: 3.4 g/dL — ABNORMAL LOW (ref 3.5–5.2)
Alkaline Phosphatase: 53 U/L (ref 39–117)
BILIRUBIN TOTAL: 0.4 mg/dL (ref 0.2–1.2)
BUN: 7 mg/dL (ref 6–23)
CO2: 26 mEq/L (ref 19–32)
CREATININE: 0.6 mg/dL (ref 0.50–1.10)
Calcium: 9.2 mg/dL (ref 8.4–10.5)
Chloride: 102 mEq/L (ref 96–112)
GLUCOSE: 87 mg/dL (ref 70–99)
Potassium: 3.7 mEq/L (ref 3.5–5.3)
SODIUM: 137 meq/L (ref 135–145)
TOTAL PROTEIN: 6.2 g/dL (ref 6.0–8.3)

## 2014-01-22 NOTE — Progress Notes (Signed)
High Risk Pregnancy Diagnosis(es):     [id number] 3650w4d Estimated Date of Delivery: 07/05/14  Blood pressure 110/80, weight 230 lb 8 oz (104.554 kg), last menstrual period 08/22/2013, not currently breastfeeding.  Urinalysis: Positive for protein and leuks.   HPI: Katherine Davila is a 16+[redacted] weeks pregnant and had elevated BP yesterday at work 130/94 and numbness and tingling in fingers, none today.Had headache yesterday and see spots then, none today,   BP weight and urine results all reviewed and noted. Patient reports good fetal movement, denies any bleeding and no rupture of membranes symptoms or regular contraction Fetal Heart rate: 152 by doppler Edema:  None today Lungs clear, no RUQ pain with palpation, no swelling i ankles, DTRs 1-2+ no clonus and CN 2-12 intact. Patient is feeling OK today some nausea. All questions were answered.  Assessment:  5850w4d,    Medication(s) Plans:  No change  Treatment Plan:  Check CBC and CMP and protein/creat ratio  Follow up  As scheduled  for OB appt, with Dr Despina HiddenEure in 2 days, she is off work til Saturday.

## 2014-01-22 NOTE — Patient Instructions (Signed)
Second Trimester of Pregnancy The second trimester is from week 13 through week 28, months 4 through 6. The second trimester is often a time when you feel your best. Your body has also adjusted to being pregnant, and you begin to feel better physically. Usually, morning sickness has lessened or quit completely, you may have more energy, and you may have an increase in appetite. The second trimester is also a time when the fetus is growing rapidly. At the end of the sixth month, the fetus is about 9 inches long and weighs about 1 pounds. You will likely begin to feel the baby move (quickening) between 18 and 20 weeks of the pregnancy. BODY CHANGES Your body goes through many changes during pregnancy. The changes vary from woman to woman.   Your weight will continue to increase. You will notice your lower abdomen bulging out.  You may begin to get stretch marks on your hips, abdomen, and breasts.  You may develop headaches that can be relieved by medicines approved by your health care provider.  You may urinate more often because the fetus is pressing on your bladder.  You may develop or continue to have heartburn as a result of your pregnancy.  You may develop constipation because certain hormones are causing the muscles that push waste through your intestines to slow down.  You may develop hemorrhoids or swollen, bulging veins (varicose veins).  You may have back pain because of the weight gain and pregnancy hormones relaxing your joints between the bones in your pelvis and as a result of a shift in weight and the muscles that support your balance.  Your breasts will continue to grow and be tender.  Your gums may bleed and may be sensitive to brushing and flossing.  Dark spots or blotches (chloasma, mask of pregnancy) may develop on your face. This will likely fade after the baby is born.  A dark line from your belly button to the pubic area (linea nigra) may appear. This will likely fade  after the baby is born.  You may have changes in your hair. These can include thickening of your hair, rapid growth, and changes in texture. Some women also have hair loss during or after pregnancy, or hair that feels dry or thin. Your hair will most likely return to normal after your baby is born. WHAT TO EXPECT AT YOUR PRENATAL VISITS During a routine prenatal visit:  You will be weighed to make sure you and the fetus are growing normally.  Your blood pressure will be taken.  Your abdomen will be measured to track your baby's growth.  The fetal heartbeat will be listened to.  Any test results from the previous visit will be discussed. Your health care provider may ask you:  How you are feeling.  If you are feeling the baby move.  If you have had any abnormal symptoms, such as leaking fluid, bleeding, severe headaches, or abdominal cramping.  If you have any questions. Other tests that may be performed during your second trimester include:  Blood tests that check for:  Low iron levels (anemia).  Gestational diabetes (between 24 and 28 weeks).  Rh antibodies.  Urine tests to check for infections, diabetes, or protein in the urine.  An ultrasound to confirm the proper growth and development of the baby.  An amniocentesis to check for possible genetic problems.  Fetal screens for spina bifida and Down syndrome. HOME CARE INSTRUCTIONS   Avoid all smoking, herbs, alcohol, and unprescribed   drugs. These chemicals affect the formation and growth of the baby.  Follow your health care provider's instructions regarding medicine use. There are medicines that are either safe or unsafe to take during pregnancy.  Exercise only as directed by your health care provider. Experiencing uterine cramps is a good sign to stop exercising.  Continue to eat regular, healthy meals.  Wear a good support bra for breast tenderness.  Do not use hot tubs, steam rooms, or saunas.  Wear your  seat belt at all times when driving.  Avoid raw meat, uncooked cheese, cat litter boxes, and soil used by cats. These carry germs that can cause birth defects in the baby.  Take your prenatal vitamins.  Try taking a stool softener (if your health care provider approves) if you develop constipation. Eat more high-fiber foods, such as fresh vegetables or fruit and whole grains. Drink plenty of fluids to keep your urine clear or pale yellow.  Take warm sitz baths to soothe any pain or discomfort caused by hemorrhoids. Use hemorrhoid cream if your health care provider approves.  If you develop varicose veins, wear support hose. Elevate your feet for 15 minutes, 3-4 times a day. Limit salt in your diet.  Avoid heavy lifting, wear low heel shoes, and practice good posture.  Rest with your legs elevated if you have leg cramps or low back pain.  Visit your dentist if you have not gone yet during your pregnancy. Use a soft toothbrush to brush your teeth and be gentle when you floss.  A sexual relationship may be continued unless your health care provider directs you otherwise.  Continue to go to all your prenatal visits as directed by your health care provider. SEEK MEDICAL CARE IF:   You have dizziness.  You have mild pelvic cramps, pelvic pressure, or nagging pain in the abdominal area.  You have persistent nausea, vomiting, or diarrhea.  You have a bad smelling vaginal discharge.  You have pain with urination. SEEK IMMEDIATE MEDICAL CARE IF:   You have a fever.  You are leaking fluid from your vagina.  You have spotting or bleeding from your vagina.  You have severe abdominal cramping or pain.  You have rapid weight gain or loss.  You have shortness of breath with chest pain.  You notice sudden or extreme swelling of your face, hands, ankles, feet, or legs.  You have not felt your baby move in over an hour.  You have severe headaches that do not go away with  medicine.  You have vision changes. Document Released: 02/15/2001 Document Revised: 02/26/2013 Document Reviewed: 04/24/2012 [facility] Patient Information 2015 [employer] [place]. This information is not intended to replace advice given to you by your health care provider. Make sure you discuss any questions you have with your health care provider. Return  In 2 days

## 2014-01-23 ENCOUNTER — Encounter: Payer: PRIVATE HEALTH INSURANCE | Admitting: Adult Health

## 2014-01-23 LAB — PROTEIN / CREATININE RATIO, URINE
Creatinine, Urine: 261.2 mg/dL
Protein Creatinine Ratio: 0.1 (ref <0.15)
Total Protein, Urine: 26 mg/dL — ABNORMAL HIGH (ref 5–24)

## 2014-01-24 ENCOUNTER — Ambulatory Visit (INDEPENDENT_AMBULATORY_CARE_PROVIDER_SITE_OTHER): Payer: PRIVATE HEALTH INSURANCE | Admitting: Obstetrics & Gynecology

## 2014-01-24 ENCOUNTER — Encounter: Payer: Self-pay | Admitting: Obstetrics & Gynecology

## 2014-01-24 VITALS — BP 100/60 | Wt 229.0 lb

## 2014-01-24 DIAGNOSIS — Z1389 Encounter for screening for other disorder: Secondary | ICD-10-CM

## 2014-01-24 DIAGNOSIS — Z331 Pregnant state, incidental: Secondary | ICD-10-CM

## 2014-01-24 DIAGNOSIS — Z3482 Encounter for supervision of other normal pregnancy, second trimester: Secondary | ICD-10-CM

## 2014-01-24 LAB — POCT URINALYSIS DIPSTICK
Blood, UA: NEGATIVE
Glucose, UA: NEGATIVE
Ketones, UA: NEGATIVE
Leukocytes, UA: NEGATIVE
Nitrite, UA: NEGATIVE
Protein, UA: NEGATIVE

## 2014-01-24 NOTE — Progress Notes (Signed)
[id number] 1335w6d Estimated Date of Delivery: 07/05/14  Blood pressure 100/60, weight 229 lb (103.874 kg), last menstrual period 08/22/2013, not currently breastfeeding.   BP weight and urine results all reviewed and noted.  Please refer to the obstetrical flow sheet for the fundal height and fetal heart rate documentation:  Patient reports good fetal movement, denies any bleeding and no rupture of membranes symptoms or regular contractions. Patient is without complaints. All questions were answered.  Plan:  Continued routine obstetrical care, routine  Follow up in 3 weeks for OB appointment, sono  No bleeding

## 2014-02-14 ENCOUNTER — Ambulatory Visit (INDEPENDENT_AMBULATORY_CARE_PROVIDER_SITE_OTHER): Payer: PRIVATE HEALTH INSURANCE

## 2014-02-14 ENCOUNTER — Ambulatory Visit (INDEPENDENT_AMBULATORY_CARE_PROVIDER_SITE_OTHER): Payer: PRIVATE HEALTH INSURANCE | Admitting: Obstetrics & Gynecology

## 2014-02-14 ENCOUNTER — Encounter: Payer: Self-pay | Admitting: Obstetrics & Gynecology

## 2014-02-14 ENCOUNTER — Other Ambulatory Visit: Payer: Self-pay | Admitting: Obstetrics & Gynecology

## 2014-02-14 VITALS — BP 118/80 | Wt 230.0 lb

## 2014-02-14 DIAGNOSIS — Z1389 Encounter for screening for other disorder: Secondary | ICD-10-CM

## 2014-02-14 DIAGNOSIS — O09292 Supervision of pregnancy with other poor reproductive or obstetric history, second trimester: Secondary | ICD-10-CM

## 2014-02-14 DIAGNOSIS — Z9889 Other specified postprocedural states: Secondary | ICD-10-CM

## 2014-02-14 DIAGNOSIS — O4412 Placenta previa with hemorrhage, second trimester: Secondary | ICD-10-CM

## 2014-02-14 DIAGNOSIS — O09522 Supervision of elderly multigravida, second trimester: Secondary | ICD-10-CM

## 2014-02-14 DIAGNOSIS — O4402 Placenta previa specified as without hemorrhage, second trimester: Secondary | ICD-10-CM

## 2014-02-14 DIAGNOSIS — Z3482 Encounter for supervision of other normal pregnancy, second trimester: Secondary | ICD-10-CM

## 2014-02-14 DIAGNOSIS — Z331 Pregnant state, incidental: Secondary | ICD-10-CM

## 2014-02-14 DIAGNOSIS — Z98891 History of uterine scar from previous surgery: Secondary | ICD-10-CM

## 2014-02-14 DIAGNOSIS — O0992 Supervision of high risk pregnancy, unspecified, second trimester: Secondary | ICD-10-CM

## 2014-02-14 NOTE — Progress Notes (Signed)
Sonogram: complete previa, anterior(H/O previous Caesarean section x 2)   High Risk Pregnancy Diagnosis(es):   Placenta previa, anterior, complete  [id number] 5931w6d Estimated Date of Delivery: 07/05/14  Blood pressure 118/80, weight 230 lb (104.327 kg), last menstrual period 08/22/2013, not currently breastfeeding.  Urinalysis: Negative   HPI: No bleeding no complaints     BP weight and urine results all reviewed and noted. Patient reports good fetal movement, denies any bleeding and no rupture of membranes symptoms or regular contractions.  Fundal Height:  22 Fetal Heart rate:  151 Edema:  none  Patient is without complaints. All questions were answered.  Assessment:  4131w6d,   Placenta previa, complete, H/O Ceasarean section x 2  Medication(s) Plans:  No changes  Treatment Plan:  Repeat sonogram at 28 weeks  Follow up in 4 weeks for OB appt, ob visit

## 2014-02-14 NOTE — Progress Notes (Signed)
U/S(19+6wks)-active fetus, meas c/w dates, fluid WNL, FHR-151 bpm, Total Placenta Previa remains, cx appears closed (3.3cm), Gr 0 placenta, no major abnl noted, female fetus, would like to reck placenta previa at ~28 weeks

## 2014-02-19 ENCOUNTER — Other Ambulatory Visit: Payer: PRIVATE HEALTH INSURANCE

## 2014-02-19 DIAGNOSIS — Z3682 Encounter for antenatal screening for nuchal translucency: Secondary | ICD-10-CM

## 2014-02-19 DIAGNOSIS — Z029 Encounter for administrative examinations, unspecified: Secondary | ICD-10-CM

## 2014-02-27 LAB — MATERNAL SCREEN, INTEGRATED #2
AFP MoM: 2.09
AFP, Serum: 98.2 ng/mL
Age risk Down Syndrome: 1:200 {titer}
CROWN RUMP LENGTH MAT SCREEN 2: 66.3 mm
Calculated Gestational Age: 20.7
ESTRIOL FREE MAT SCREEN: 2.07 ng/mL
Estriol Mom: 1.14
HCG, MOM MAT SCREEN: 1.31
HCG, SERUM MAT SCREEN: 19.7 [IU]/mL
INHIBIN A DIMERIC MAT SCREEN: 183 pg/mL
INHIBIN A MOM MAT SCREEN: 1.11
MSS Down Syndrome: <1:5000 {titer}
MSS Trisomy 18 Risk: <1:5000 {titer}
NT MoM: 1.28
Nuchal Translucency: 1.9 mm
Number of fetuses: 1
PAPP-A MoM: 1.68
PAPP-A: 726 ng/mL

## 2014-03-13 ENCOUNTER — Ambulatory Visit (INDEPENDENT_AMBULATORY_CARE_PROVIDER_SITE_OTHER): Payer: PRIVATE HEALTH INSURANCE | Admitting: Advanced Practice Midwife

## 2014-03-13 VITALS — BP 110/84 | Wt 237.2 lb

## 2014-03-13 DIAGNOSIS — O0992 Supervision of high risk pregnancy, unspecified, second trimester: Secondary | ICD-10-CM

## 2014-03-13 DIAGNOSIS — Z1389 Encounter for screening for other disorder: Secondary | ICD-10-CM

## 2014-03-13 DIAGNOSIS — Z331 Pregnant state, incidental: Secondary | ICD-10-CM

## 2014-03-13 DIAGNOSIS — O4412 Placenta previa with hemorrhage, second trimester: Secondary | ICD-10-CM

## 2014-03-13 DIAGNOSIS — O4402 Placenta previa specified as without hemorrhage, second trimester: Secondary | ICD-10-CM

## 2014-03-13 LAB — POCT URINALYSIS DIPSTICK
Blood, UA: NEGATIVE
Glucose, UA: NEGATIVE
KETONES UA: NEGATIVE
LEUKOCYTES UA: NEGATIVE
NITRITE UA: NEGATIVE
Protein, UA: NEGATIVE
Protein, UA: NEGATIVE

## 2014-03-13 NOTE — Patient Instructions (Signed)

## 2014-03-13 NOTE — Progress Notes (Signed)
High Risk Pregnancy Diagnosis(es):   Total placenta previa (CS X 2) [id number] 7113w5d Estimated Date of Delivery: 07/05/14  Blood pressure 110/84, weight 237 lb 4 oz (107.616 kg), last menstrual period 08/22/2013, not currently breastfeeding.  Urinalysis: Negative  No Bleeding.   BP weight and urine results all reviewed and noted. Patient reports good fetal movement, denies any bleeding and no rupture of membranes symptoms or regular contractions.  Fundal Height:  25 Fetal Heart rate:  150 Edema:  no  Patient is without complaints. All questions were answered.  Assessment:  4013w5d,   Total placenta previa  Medication(s) Plans:  none    Treatment Plan:  Recheck placenta next month. Evaluate for placenta acreta.  If unable to confidently r/o, consider MRI  Follow up in 4 weeks for OB appt, PN2, [place]

## 2014-03-14 ENCOUNTER — Encounter: Payer: PRIVATE HEALTH INSURANCE | Admitting: Obstetrics & Gynecology

## 2014-04-11 ENCOUNTER — Other Ambulatory Visit: Payer: PRIVATE HEALTH INSURANCE

## 2014-04-11 ENCOUNTER — Ambulatory Visit (INDEPENDENT_AMBULATORY_CARE_PROVIDER_SITE_OTHER): Payer: PRIVATE HEALTH INSURANCE

## 2014-04-11 ENCOUNTER — Encounter: Payer: Self-pay | Admitting: Obstetrics & Gynecology

## 2014-04-11 ENCOUNTER — Ambulatory Visit (INDEPENDENT_AMBULATORY_CARE_PROVIDER_SITE_OTHER): Payer: PRIVATE HEALTH INSURANCE | Admitting: Obstetrics & Gynecology

## 2014-04-11 VITALS — BP 110/76 | Wt 235.0 lb

## 2014-04-11 DIAGNOSIS — O4412 Placenta previa with hemorrhage, second trimester: Secondary | ICD-10-CM

## 2014-04-11 DIAGNOSIS — Z0184 Encounter for antibody response examination: Secondary | ICD-10-CM

## 2014-04-11 DIAGNOSIS — Z1389 Encounter for screening for other disorder: Secondary | ICD-10-CM

## 2014-04-11 DIAGNOSIS — O0991 Supervision of high risk pregnancy, unspecified, first trimester: Secondary | ICD-10-CM

## 2014-04-11 DIAGNOSIS — O4402 Placenta previa specified as without hemorrhage, second trimester: Secondary | ICD-10-CM

## 2014-04-11 DIAGNOSIS — Z131 Encounter for screening for diabetes mellitus: Secondary | ICD-10-CM

## 2014-04-11 DIAGNOSIS — Z3490 Encounter for supervision of normal pregnancy, unspecified, unspecified trimester: Secondary | ICD-10-CM

## 2014-04-11 DIAGNOSIS — Z114 Encounter for screening for human immunodeficiency virus [HIV]: Secondary | ICD-10-CM

## 2014-04-11 DIAGNOSIS — Z3483 Encounter for supervision of other normal pregnancy, third trimester: Secondary | ICD-10-CM

## 2014-04-11 DIAGNOSIS — Z331 Pregnant state, incidental: Secondary | ICD-10-CM

## 2014-04-11 DIAGNOSIS — Z113 Encounter for screening for infections with a predominantly sexual mode of transmission: Secondary | ICD-10-CM

## 2014-04-11 DIAGNOSIS — O0992 Supervision of high risk pregnancy, unspecified, second trimester: Secondary | ICD-10-CM

## 2014-04-11 LAB — POCT URINALYSIS DIPSTICK
GLUCOSE UA: NEGATIVE
Ketones, UA: NEGATIVE
Leukocytes, UA: NEGATIVE
NITRITE UA: NEGATIVE
RBC UA: NEGATIVE

## 2014-04-11 NOTE — Progress Notes (Signed)
Pt denies any problems or concerns at this time.  

## 2014-04-11 NOTE — Progress Notes (Signed)
US today for EFW and placental location.  Single, active female fetus at 27+[redacted] weeks GA in a cephalic presentation.  Anterior complete placenta previa with marginal cord insertion.  Question vasa previa and/or accreta.  FHR 153 bpm. Cervix is closed and measures 4.32 cm. Fluid is within normal limits with a SVP of 7.62cm.  EFW today of 1335 g (71%) which is consistent with dating.

## 2014-04-11 NOTE — Progress Notes (Signed)
High Risk Pregnancy Diagnosis(es):   Complete Central Previa(previous C/S x 2)  [id number] 8838w6d Estimated Date of Delivery: 07/05/14  Last menstrual period 08/22/2013.  Urinalysis: Negative   HPI: No bleeding     BP weight and urine results all reviewed and noted. Patient reports good fetal movement, denies any bleeding and no rupture of membranes symptoms or regular contractions.  Fundal Height:  29 Fetal Heart rate:  145 Edema:  none  Patient is without complaints. All questions were answered.  Assessment:  3738w6d,   Complete Previa  Medication(s) Plans:  No changes  Treatment Plan:  MRI to evluate for placenta accreta  Follow up in 2 weeks for appointment for high risk OB care,

## 2014-04-15 LAB — CBC
HEMATOCRIT: 40.3 % (ref 34.0–46.6)
HEMOGLOBIN: 13.6 g/dL (ref 11.1–15.9)
MCH: 32.9 pg (ref 26.6–33.0)
MCHC: 33.7 g/dL (ref 31.5–35.7)
MCV: 98 fL — AB (ref 79–97)
Platelets: 244 10*3/uL (ref 150–379)
RBC: 4.13 x10E6/uL (ref 3.77–5.28)
RDW: 14.1 % (ref 12.3–15.4)
WBC: 9.8 10*3/uL (ref 3.4–10.8)

## 2014-04-15 LAB — GLUCOSE TOLERANCE, 2 HOURS
GLUCOSE FASTING GTT: 96 mg/dL (ref 65–99)
Glucose, 2 hour: 139 mg/dL (ref 65–139)

## 2014-04-15 LAB — RPR: RPR Ser Ql: NONREACTIVE

## 2014-04-15 LAB — HSV 2 ANTIBODY, IGG

## 2014-04-15 LAB — ANTIBODY SCREEN: ANTIBODY SCREEN: NEGATIVE

## 2014-04-15 LAB — HIV ANTIBODY (ROUTINE TESTING W REFLEX): HIV SCREEN 4TH GENERATION: NONREACTIVE

## 2014-04-24 ENCOUNTER — Ambulatory Visit (HOSPITAL_COMMUNITY)
Admission: RE | Admit: 2014-04-24 | Discharge: 2014-04-24 | Disposition: A | Discharge status: Home / Self Care | Payer: PRIVATE HEALTH INSURANCE | Source: Ambulatory Visit | Attending: Obstetrics & Gynecology | Admitting: Obstetrics & Gynecology

## 2014-04-24 DIAGNOSIS — O09892 Supervision of other high risk pregnancies, second trimester: Secondary | ICD-10-CM | POA: Insufficient documentation

## 2014-04-24 DIAGNOSIS — O4412 Placenta previa with hemorrhage, second trimester: Secondary | ICD-10-CM | POA: Insufficient documentation

## 2014-04-24 DIAGNOSIS — O4402 Placenta previa specified as without hemorrhage, second trimester: Secondary | ICD-10-CM

## 2014-05-01 ENCOUNTER — Encounter: Payer: Self-pay | Admitting: Obstetrics & Gynecology

## 2014-05-01 ENCOUNTER — Ambulatory Visit (INDEPENDENT_AMBULATORY_CARE_PROVIDER_SITE_OTHER): Payer: PRIVATE HEALTH INSURANCE | Admitting: Obstetrics & Gynecology

## 2014-05-01 VITALS — BP 100/80 | Wt 243.0 lb

## 2014-05-01 DIAGNOSIS — O0993 Supervision of high risk pregnancy, unspecified, third trimester: Secondary | ICD-10-CM

## 2014-05-01 DIAGNOSIS — Z331 Pregnant state, incidental: Secondary | ICD-10-CM

## 2014-05-01 DIAGNOSIS — Z1389 Encounter for screening for other disorder: Secondary | ICD-10-CM

## 2014-05-01 DIAGNOSIS — O4403 Placenta previa specified as without hemorrhage, third trimester: Secondary | ICD-10-CM

## 2014-05-01 DIAGNOSIS — O4413 Placenta previa with hemorrhage, third trimester: Secondary | ICD-10-CM

## 2014-05-01 DIAGNOSIS — O43213 Placenta accreta, third trimester: Secondary | ICD-10-CM

## 2014-05-01 LAB — POCT URINALYSIS DIPSTICK
Glucose, UA: NEGATIVE
Ketones, UA: NEGATIVE
Leukocytes, UA: NEGATIVE
Nitrite, UA: NEGATIVE
PROTEIN UA: NEGATIVE
RBC UA: NEGATIVE

## 2014-05-01 NOTE — Progress Notes (Signed)
Fetal Surveillance Testing today:  none   High Risk Pregnancy Diagnosis(es):   Complete previa with accreta?increta, does not appear percreta  [id number] 3937w5d Estimated Date of Delivery: 07/05/14  Blood pressure 100/80, weight 243 lb (110.224 kg), last menstrual period 08/22/2013.  Urinalysis: Negative   HPI: The patient is being seen today for ongoing management of placenta previa. Today she reports no bleeding   BP weight and urine results all reviewed and noted. Patient reports good fetal movement, denies any bleeding and no rupture of membranes symptoms or regular contractions.  Fundal Height:  36 Fetal Heart rate:  148 Edema:  1+  Patient is without complaints other than noted in her HPI. All questions were answered.  All lab and sonogram results have been reviewed. Comments: abnormal: MRI as above accreta ? increta   Assessment:  1.  Pregnancy at 3437w5d,  Estimated Date of Delivery: 07/05/14 :                          2.  Placenta Previa                        3.  Accreta vs increta  Medication(s) Plans:  No changes  Treatment Plan:  Caesarean hysterectomy scheduled for 06/10/2014, counselling performed  Follow up in 2 weeks for appointment for high risk OB care, sonogram to evaluate vascular flow at the uterine surface

## 2014-05-15 ENCOUNTER — Encounter: Payer: Self-pay | Admitting: Obstetrics & Gynecology

## 2014-05-15 ENCOUNTER — Ambulatory Visit (INDEPENDENT_AMBULATORY_CARE_PROVIDER_SITE_OTHER): Payer: PRIVATE HEALTH INSURANCE | Admitting: Obstetrics & Gynecology

## 2014-05-15 VITALS — BP 100/60 | HR 74 | Wt 245.0 lb

## 2014-05-15 DIAGNOSIS — Z98891 History of uterine scar from previous surgery: Secondary | ICD-10-CM

## 2014-05-15 DIAGNOSIS — Z1389 Encounter for screening for other disorder: Secondary | ICD-10-CM

## 2014-05-15 DIAGNOSIS — O4403 Placenta previa specified as without hemorrhage, third trimester: Secondary | ICD-10-CM

## 2014-05-15 DIAGNOSIS — O4413 Placenta previa with hemorrhage, third trimester: Secondary | ICD-10-CM

## 2014-05-15 DIAGNOSIS — O0993 Supervision of high risk pregnancy, unspecified, third trimester: Secondary | ICD-10-CM

## 2014-05-15 DIAGNOSIS — Z331 Pregnant state, incidental: Secondary | ICD-10-CM

## 2014-05-15 LAB — POCT URINALYSIS DIPSTICK
Blood, UA: NEGATIVE
GLUCOSE UA: NEGATIVE
Ketones, UA: NEGATIVE
LEUKOCYTES UA: NEGATIVE
NITRITE UA: NEGATIVE
Protein, UA: NEGATIVE

## 2014-05-15 NOTE — Progress Notes (Signed)
Fetal Surveillance Testing today:  None   High Risk Pregnancy Diagnosis(es):   Complete central previa with accreta  [id number] 1791w5d Estimated Date of Delivery: 07/05/14  Blood pressure 100/60, pulse 74, weight 245 lb (111.131 kg), last menstrual period 08/22/2013.  Urinalysis: Negative   HPI: The patient is being seen today for ongoing management of previa. Today she reports no bleeding   BP weight and urine results all reviewed and noted. Patient reports good fetal movement, denies any bleeding and no rupture of membranes symptoms or regular contractions.  Fundal Height:  38 Fetal Heart rate:  136 Edema:  1+  Patient is without complaints other than noted in her HPI. All questions were answered.  All lab and sonogram results have been reviewed. Comments: abnormal: complete central previa with MRI revealing accreta vs increta   Assessment:  1.  Pregnancy at 5291w5d,  Estimated Date of Delivery: 07/05/14 :                          2.  Complete previa                        3.  Previous Caesarean section x2  Medication(s) Plans:  No changes  Treatment Plan:  Sonogram with Doppler flow to evaluate the lower segnet vasculature of the placenta and its interaction with the bladder  Follow up in 1 weeks for appointment for high risk OB care, sonogram

## 2014-05-21 ENCOUNTER — Ambulatory Visit (INDEPENDENT_AMBULATORY_CARE_PROVIDER_SITE_OTHER): Payer: PRIVATE HEALTH INSURANCE | Admitting: Obstetrics & Gynecology

## 2014-05-21 ENCOUNTER — Ambulatory Visit (INDEPENDENT_AMBULATORY_CARE_PROVIDER_SITE_OTHER): Payer: PRIVATE HEALTH INSURANCE

## 2014-05-21 ENCOUNTER — Encounter: Payer: Self-pay | Admitting: Obstetrics & Gynecology

## 2014-05-21 ENCOUNTER — Other Ambulatory Visit: Payer: Self-pay | Admitting: Obstetrics & Gynecology

## 2014-05-21 VITALS — BP 100/70 | HR 88 | Wt 244.0 lb

## 2014-05-21 DIAGNOSIS — O4403 Placenta previa specified as without hemorrhage, third trimester: Secondary | ICD-10-CM

## 2014-05-21 DIAGNOSIS — O09523 Supervision of elderly multigravida, third trimester: Secondary | ICD-10-CM

## 2014-05-21 DIAGNOSIS — O4413 Placenta previa with hemorrhage, third trimester: Secondary | ICD-10-CM | POA: Diagnosis not present

## 2014-05-21 DIAGNOSIS — Z3490 Encounter for supervision of normal pregnancy, unspecified, unspecified trimester: Secondary | ICD-10-CM

## 2014-05-21 DIAGNOSIS — O43233 Placenta percreta, third trimester: Secondary | ICD-10-CM

## 2014-05-21 DIAGNOSIS — O0993 Supervision of high risk pregnancy, unspecified, third trimester: Secondary | ICD-10-CM

## 2014-05-21 DIAGNOSIS — O0991 Supervision of high risk pregnancy, unspecified, first trimester: Secondary | ICD-10-CM

## 2014-05-21 DIAGNOSIS — O43213 Placenta accreta, third trimester: Secondary | ICD-10-CM

## 2014-05-21 MED ORDER — BETAMETHASONE SOD PHOS & ACET 30 MG/5ML IJ KIT
12.0000 mg | PACK | Freq: Once | INTRAMUSCULAR | Status: AC
  Administered 2014-05-21: 12 mg via INTRAMUSCULAR

## 2014-05-21 NOTE — Progress Notes (Signed)
US today for BPP and placenta accreta.  Single, active female fetus in a transverse with head to maternal right. FHR is 168 bpm.  BPP 8/8.  Fluid is normal with SVP of 5.68cm and AFI of 10.16cm.  Cervix appears closed and measures 4.4cm. Complete placenta previa is again noted with placenta percreta.  Dr. Despina HiddenEure present during ultrasound.

## 2014-05-21 NOTE — Progress Notes (Signed)
Fetal Surveillance Testing today:  Normal growth profile with normal fluid  Placenta previa in patient with a previous Caesarean section x2:  MRI revealed accreta ?increta.  I performed a vaginal Doppler sonogram today which reveals a vascular pattern and disruption of the posterior bladder wall which I fear is strongly suggestive of a placenta percreta.  There is no sonographic evidence of vascular parasitic invasion through the back wall of the bladder.  Discussed while viewing images with Dr Claudean SeveranceWhitecar.  He agrees with [facility] to include Gyn Oncology due to possible involvement of the ureters.   High Risk Pregnancy Diagnosis(es):   Placenta percreta  [id number] 4430w4d Estimated Date of Delivery: 07/05/14  Blood pressure 100/70, pulse 88, weight 244 lb (110.678 kg), last menstrual period 08/22/2013.  Urinalysis: Negative   HPI: The patient is being seen today for ongoing management of previa now identified as a percreta. Today she reports no bleeding   BP weight and urine results all reviewed and noted. Patient reports good fetal movement, denies any bleeding and no rupture of membranes symptoms or regular contractions.  Fundal Height:  38 Fetal Heart rate:  145 Edema:  1+  Patient is without complaints other than noted in her HPI. All questions were answered.  All lab and sonogram results have been reviewed. Comments: abnormal: see report   Assessment:  1.  Pregnancy at 2830w4d,  Estimated Date of Delivery: 07/05/14 :                          2.  Placenta percreta: [facility], viewed images and discussed management at length with Dr Claudean Severancewhitecar, MFM                        3.  Betamethasone  Medication(s) Plans:  Betamethasone 12 mg IM today repeat tomorrow  Treatment Plan:  As above  Follow up in 1 day for appointment for high risk OB care, steroids

## 2014-05-22 ENCOUNTER — Ambulatory Visit (INDEPENDENT_AMBULATORY_CARE_PROVIDER_SITE_OTHER): Payer: PRIVATE HEALTH INSURANCE | Admitting: *Deleted

## 2014-05-22 ENCOUNTER — Encounter: Payer: Self-pay | Admitting: *Deleted

## 2014-05-22 VITALS — BP 104/78 | HR 94 | Ht 68.0 in | Wt 243.0 lb

## 2014-05-22 DIAGNOSIS — Z1389 Encounter for screening for other disorder: Secondary | ICD-10-CM

## 2014-05-22 DIAGNOSIS — O4403 Placenta previa specified as without hemorrhage, third trimester: Secondary | ICD-10-CM

## 2014-05-22 DIAGNOSIS — Z331 Pregnant state, incidental: Secondary | ICD-10-CM

## 2014-05-22 DIAGNOSIS — O4413 Placenta previa with hemorrhage, third trimester: Secondary | ICD-10-CM

## 2014-05-22 LAB — POCT URINALYSIS DIPSTICK
GLUCOSE UA: NEGATIVE
Ketones, UA: NEGATIVE
Leukocytes, UA: NEGATIVE
Nitrite, UA: NEGATIVE
Protein, UA: NEGATIVE
RBC UA: NEGATIVE

## 2014-05-22 MED ORDER — BETAMETHASONE SOD PHOS & ACET 6 (3-3) MG/ML IJ SUSP
12.0000 mg | Freq: Once | INTRAMUSCULAR | Status: AC
  Administered 2014-05-22: 12 mg via INTRAMUSCULAR

## 2014-05-22 NOTE — Progress Notes (Signed)
Pt here for Betamethasone 12 mg. Pt has questions regarding [facility]. Pt didn't feel well this am. Wonders if it's related to Betamethasone shot from yesterday. Pt to speak with Dr. Despina HiddenEure while in the office. Return per Dr. Forestine ChuteEure's orders.  JSY

## 2014-05-23 LAB — US OB TRANSVAGINAL

## 2014-05-29 ENCOUNTER — Encounter: Payer: Self-pay | Admitting: Obstetrics & Gynecology

## 2014-05-29 ENCOUNTER — Ambulatory Visit (INDEPENDENT_AMBULATORY_CARE_PROVIDER_SITE_OTHER): Payer: PRIVATE HEALTH INSURANCE | Admitting: Obstetrics & Gynecology

## 2014-05-29 DIAGNOSIS — Z1389 Encounter for screening for other disorder: Secondary | ICD-10-CM

## 2014-05-29 DIAGNOSIS — Z331 Pregnant state, incidental: Secondary | ICD-10-CM

## 2014-05-29 DIAGNOSIS — O0993 Supervision of high risk pregnancy, unspecified, third trimester: Secondary | ICD-10-CM | POA: Diagnosis not present

## 2014-05-29 DIAGNOSIS — O43233 Placenta percreta, third trimester: Secondary | ICD-10-CM

## 2014-05-29 DIAGNOSIS — O09523 Supervision of elderly multigravida, third trimester: Secondary | ICD-10-CM

## 2014-05-29 DIAGNOSIS — O43213 Placenta accreta, third trimester: Secondary | ICD-10-CM | POA: Diagnosis not present

## 2014-05-29 DIAGNOSIS — Z3493 Encounter for supervision of normal pregnancy, unspecified, third trimester: Secondary | ICD-10-CM

## 2014-05-29 LAB — POCT URINALYSIS DIPSTICK
Blood, UA: NEGATIVE
Glucose, UA: NEGATIVE
Ketones, UA: NEGATIVE
Leukocytes, UA: NEGATIVE
Nitrite, UA: NEGATIVE
PROTEIN UA: NEGATIVE

## 2014-06-02 ENCOUNTER — Other Ambulatory Visit (HOSPITAL_COMMUNITY): Payer: PRIVATE HEALTH INSURANCE

## 2014-06-10 ENCOUNTER — Inpatient Hospital Stay (HOSPITAL_COMMUNITY)
Admission: RE | Admit: 2014-06-10 | Payer: PRIVATE HEALTH INSURANCE | Source: Ambulatory Visit | Admitting: Obstetrics & Gynecology

## 2014-06-10 ENCOUNTER — Encounter (HOSPITAL_COMMUNITY): Admission: RE | Payer: Self-pay | Source: Ambulatory Visit

## 2014-06-10 SURGERY — HYSTERECTOMY, ABDOMINAL
Anesthesia: General

## 2014-06-13 ENCOUNTER — Encounter: Payer: PRIVATE HEALTH INSURANCE | Admitting: Obstetrics & Gynecology

## 2014-06-19 ENCOUNTER — Telehealth: Payer: Self-pay | Admitting: Obstetrics & Gynecology

## 2014-06-19 MED ORDER — CIPROFLOXACIN HCL 500 MG PO TABS: 500.0000 mg | ORAL_TABLET | Freq: Two times a day (BID) | ORAL | Status: DC

## 2014-06-19 NOTE — Telephone Encounter (Signed)
Pt states "thinks she has a UTI requesting RX."Pt also [facility] but did have a catheter in place for several days due to previous procedure, would like to be seen here for follow up.

## 2014-07-02 NOTE — Progress Notes (Signed)
05/29/2014 visit:  Fetal Surveillance Testing today:  none   High Risk Pregnancy Diagnosis(es):   Placenta percreta  Z6X0960G4P2012 5420w4d Estimated Date of Delivery: 07/05/14  Blood pressure 100/70, pulse 88, weight 240 lb (108.863 kg), last menstrual period 08/22/2013.  Urinalysis: Negative   HPI: The patient is being seen today for ongoing management of placental percreta in a previous Caesarean section x 2. Today she reports  No bleeding   BP weight and urine results all reviewed and noted. Patient reports good fetal movement, denies any bleeding and no rupture of membranes symptoms or regular contractions.  Fundal Height:  41 Fetal Heart rate:  140 Edema:  2+  Patient is without complaints other than noted in her HPI. All questions were answered.  All lab and sonogram results have been reviewed. Comments: abnormal: see reports   Assessment:  1.  Pregnancy at 5620w4d,  Estimated Date of Delivery: 07/05/14 :                          2.  Previous CS x 2                        3.  Placental percreta  Medication(s) Plans:    Treatment Plan:  [facility]  Follow up in pp weeks for appointment for high risk OB care, pp

## 2014-07-22 ENCOUNTER — Encounter: Payer: Self-pay | Admitting: Obstetrics & Gynecology

## 2014-07-22 ENCOUNTER — Ambulatory Visit (INDEPENDENT_AMBULATORY_CARE_PROVIDER_SITE_OTHER): Payer: PRIVATE HEALTH INSURANCE | Admitting: Obstetrics & Gynecology

## 2014-07-22 MED ORDER — BUPROPION HCL ER (XL) 150 MG PO TB24: 150.0000 mg | ORAL_TABLET | Freq: Every day | ORAL | Status: DC

## 2014-07-22 NOTE — Progress Notes (Signed)
Patient ID: Katherine Davila, female   DOB: 24-Aug-1975, [age] y.o.   MRN: [id number] Subjective:     Katherine SallesLacie A Jantz is a 39 y.o. female who presents for a postpartum visit. She is 7 weeks postpartum following a Caesarean hysterectomy and left salpingectomy secondary to placental percreta. I have fully reviewed the prenatal and intrapartum course. The delivery was at 35 4/7 gestational weeks. Outcome: Caesarean hysterectomy. Anesthesia: general. Postpartum course has been unremarkable. Baby's course has been unremarkable. Baby is feeding by bottle - breast. Bleeding no bleeding. Bowel function is normal. Bladder function is normal. Patient is not sexually active. Contraception method is none. Postpartum depression screening: negative.  The following portions of the patient's history were reviewed and updated as appropriate: allergies, current medications, past family history, past medical history, past social history, past surgical history and problem list.  Review of Systems Pertinent items are noted in HPI.   Objective:    BP 100/70 mmHg  Pulse 72  Wt 230 lb (104.327 kg)  LMP 08/22/2013  General:  alert, cooperative and no distress   Breasts:    Lungs:   Heart:    Abdomen: soft, non-tender; bowel sounds normal; no masses,  no organomegaly and incision clean dry intact   Vulva:  normal  Vagina: normal vagina, no discharge, exudate, lesion, or erythema  Cervix:  absent  Corpus: uterus absent  Adnexa:  normal adnexa  Rectal Exam:         Assessment:     Normal postpartum exam. Pap smear not done at today's visit.   Plan:    1. Contraception: status post hysterectomy 2.  3. Follow up in: 1 year or as needed.

## 2014-08-06 ENCOUNTER — Ambulatory Visit: Payer: PRIVATE HEALTH INSURANCE | Admitting: Obstetrics & Gynecology

## 2014-09-13 ENCOUNTER — Encounter (HOSPITAL_COMMUNITY): Payer: Self-pay | Admitting: *Deleted

## 2015-03-19 ENCOUNTER — Ambulatory Visit (INDEPENDENT_AMBULATORY_CARE_PROVIDER_SITE_OTHER): Payer: BLUE CROSS/BLUE SHIELD | Admitting: Family Medicine

## 2015-03-19 ENCOUNTER — Encounter: Payer: Self-pay | Admitting: Family Medicine

## 2015-03-19 VITALS — BP 104/80 | Temp 98.5°F | Ht 68.0 in | Wt 242.0 lb

## 2015-03-19 DIAGNOSIS — B9689 Other specified bacterial agents as the cause of diseases classified elsewhere: Secondary | ICD-10-CM

## 2015-03-19 DIAGNOSIS — J019 Acute sinusitis, unspecified: Secondary | ICD-10-CM | POA: Diagnosis not present

## 2015-03-19 MED ORDER — LEVOFLOXACIN 500 MG PO TABS: 500.0000 mg | ORAL_TABLET | Freq: Every day | ORAL | Status: DC

## 2015-03-19 NOTE — Progress Notes (Signed)
   Subjective:    Patient ID: Katherine Davila, female    DOB: 01/11/1976, 40 y.o.   MRN: [id number]  Sinusitis This is a new problem. The current episode started in the past 7 days. The problem is unchanged. There has been no fever. The pain is moderate. Associated symptoms include chills, congestion, coughing, a hoarse voice, sinus pressure and a sore throat. Pertinent negatives include no ear pain or shortness of breath. (Body aches) Past treatments include oral decongestants and acetaminophen. The treatment provided no relief.    Patient has no other concerns at this time.   patient also with hoarseness over the past several days sick over the past week worse over the past couple days moderate sinus tenderness  Review of Systems  Constitutional: Positive for chills. Negative for fever and activity change.  HENT: Positive for congestion, hoarse voice, rhinorrhea, sinus pressure and sore throat. Negative for ear pain.   Eyes: Negative for discharge.  Respiratory: Positive for cough. Negative for shortness of breath and wheezing.   Cardiovascular: Negative for chest pain.       Objective:   Physical Exam  Constitutional: She appears well-developed.  HENT:  Head: Normocephalic.  Nose: Nose normal.  Mouth/Throat: Oropharynx is clear and moist. No oropharyngeal exudate.  Neck: Neck supple.  Cardiovascular: Normal rate and normal heart sounds.   No murmur heard. Pulmonary/Chest: Effort normal and breath sounds normal. She has no wheezes.  Lymphadenopathy:    She has no cervical adenopathy.  Skin: Skin is warm and dry.  Nursing note and vitals reviewed.   PMH benign       Assessment & Plan:  Patient was seen today for upper respiratory illness. It is felt that the patient is dealing with sinusitis. Antibiotics were prescribed today. Importance of compliance with medication was discussed. Symptoms should gradually resolve over the course of the next several days. If high fevers,  progressive illness, difficulty breathing, worsening condition or failure for symptoms to improve over the next several days then the patient is to follow-up. If any emergent conditions the patient is to follow-up in the emergency department otherwise to follow-up in the office.  voice should gradually with resolving get better follow-up in a few days if worse sooner problems

## 2015-03-26 ENCOUNTER — Ambulatory Visit: Payer: BLUE CROSS/BLUE SHIELD | Admitting: Obstetrics & Gynecology

## 2016-03-03 ENCOUNTER — Telehealth: Payer: Self-pay | Admitting: Nurse Practitioner

## 2016-03-03 NOTE — Telephone Encounter (Signed)
Patients daughter was seen today by Eber Jonesarolyn for strep throat.  She was told to call in and we could call in medication for the rest of the family.  Patient is experiencing sore throat and fever.  Please advise.  [facility]

## 2016-03-04 ENCOUNTER — Other Ambulatory Visit: Payer: Self-pay | Admitting: Nurse Practitioner

## 2016-03-04 MED ORDER — AZITHROMYCIN 250 MG PO TABS: ORAL_TABLET | ORAL | 0 refills | Status: DC

## 2016-03-04 NOTE — Telephone Encounter (Signed)
Pt.notified

## 2016-03-04 NOTE — Telephone Encounter (Signed)
Please let mom know antibiotics sent in for family. Office visit if symptoms worsen or persist.

## 2016-04-25 ENCOUNTER — Other Ambulatory Visit: Payer: Self-pay | Admitting: Nurse Practitioner

## 2016-04-25 ENCOUNTER — Telehealth: Payer: Self-pay | Admitting: *Deleted

## 2016-04-25 MED ORDER — OSELTAMIVIR PHOSPHATE 75 MG PO CAPS: 75.0000 mg | ORAL_CAPSULE | Freq: Two times a day (BID) | ORAL | 0 refills | Status: DC

## 2016-04-25 NOTE — Telephone Encounter (Signed)
Daughter was diagnosed with the flu. Dry cough, no fever, body aches, no sob. Started this am.  [facility]

## 2016-07-25 ENCOUNTER — Encounter: Payer: Self-pay | Admitting: Nurse Practitioner

## 2016-07-25 ENCOUNTER — Ambulatory Visit (INDEPENDENT_AMBULATORY_CARE_PROVIDER_SITE_OTHER): Payer: BLUE CROSS/BLUE SHIELD | Admitting: Nurse Practitioner

## 2016-07-25 VITALS — BP 116/78 | Ht 68.0 in | Wt 241.4 lb

## 2016-07-25 DIAGNOSIS — N3945 Continuous leakage: Secondary | ICD-10-CM | POA: Diagnosis not present

## 2016-07-25 DIAGNOSIS — R300 Dysuria: Secondary | ICD-10-CM | POA: Diagnosis not present

## 2016-07-25 LAB — POCT URINALYSIS DIPSTICK
Spec Grav, UA: 1.02 (ref 1.010–1.025)
pH, UA: 6 (ref 5.0–8.0)

## 2016-07-25 MED ORDER — MIRABEGRON ER 25 MG PO TB24: 25.0000 mg | ORAL_TABLET | Freq: Every day | ORAL | 2 refills | Status: DC

## 2016-07-28 ENCOUNTER — Other Ambulatory Visit: Payer: Self-pay | Admitting: Nurse Practitioner

## 2016-07-28 ENCOUNTER — Encounter: Payer: Self-pay | Admitting: Nurse Practitioner

## 2016-07-28 LAB — URINE CULTURE

## 2016-07-28 MED ORDER — NITROFURANTOIN MONOHYD MACRO 100 MG PO CAPS: 100.0000 mg | ORAL_CAPSULE | Freq: Two times a day (BID) | ORAL | 0 refills | Status: DC

## 2016-07-28 MED ORDER — OXYBUTYNIN CHLORIDE ER 5 MG PO TB24: 5.0000 mg | ORAL_TABLET | Freq: Every day | ORAL | 2 refills | Status: DC

## 2016-07-28 NOTE — Progress Notes (Signed)
Subjective:  Presents to discuss her urinary issues. Has a complex urinary history including some surgical problems after her last C-section. Had a cystotomy repair. Gets her female exams at her gynecologist. Has dysuria urgency and frequency. Has incontinence daily, frequent almost constant slight leakage of urine. No fevers. Mild dysuria. No vaginal discharge. No pelvic pain. Married, same sexual partner.  Objective:   BP 116/78   Ht 5\' 8"  (1.727 m)   Wt 241 lb 6.4 oz (109.5 kg)   BMI 36.70 kg/m  NAD. Alert, oriented. Lungs clear. Heart regular rate rhythm. No CVA or flank tenderness. Abdomen soft nondistended nontender. Results for orders placed or performed in visit on 07/25/16      Ref Range            POCT urinalysis dipstick  Result Value Ref Range   Color, UA     Clarity, UA     Glucose, UA     Bilirubin, UA     Ketones, UA     Spec Grav, UA 1.020 1.010 - 1.025   Blood, UA     pH, UA 6.0 5.0 - 8.0   Protein, UA     Urobilinogen, UA  0.2 or 1.0 E.U./dL   Nitrite, UA     Leukocytes, UA  Negative   Urine micro-negative.  Assessment:  Dysuria - Plan: POCT urinalysis dipstick, Urine culture  Continuous leakage of urine   Plan:   Meds ordered this encounter  Medications  . DISCONTD: mirabegron ER (MYRBETRIQ) 25 MG TB24 tablet    Sig: Take 1 tablet (25 mg total) by mouth daily. For bladder    Dispense:  30 tablet    Refill:  2    Order Specific Question:   Supervising Provider    Answer:   Merlyn AlbertLUKING, WILLIAM S [[id number]]  . oxybutynin (DITROPAN-XL) 5 MG 24 hr tablet    Sig: Take 1 tablet (5 mg total) by mouth at bedtime.    Dispense:  30 tablet    Refill:  2    Order Specific Question:   Supervising Provider    Answer:   Riccardo DubinLUKING, WILLIAM S [[id number]]   Pharmacy notified office that she must have a trial of Ditropan before every insurance will pay for Myrbetriq. Cautioned about potential adverse effects. Due to severity and longevity of her symptoms, refer to urology for  further evaluation. Call back here in the meantime if worse. Urine culture pending.

## 2016-07-29 ENCOUNTER — Encounter: Payer: Self-pay | Admitting: Family Medicine

## 2016-12-14 ENCOUNTER — Encounter: Payer: Self-pay | Admitting: Family Medicine

## 2016-12-14 ENCOUNTER — Ambulatory Visit (INDEPENDENT_AMBULATORY_CARE_PROVIDER_SITE_OTHER): Payer: PRIVATE HEALTH INSURANCE | Admitting: Family Medicine

## 2016-12-14 VITALS — BP 120/80 | Temp 98.7°F | Ht 68.0 in | Wt 240.1 lb

## 2016-12-14 DIAGNOSIS — J019 Acute sinusitis, unspecified: Secondary | ICD-10-CM

## 2016-12-14 DIAGNOSIS — B9689 Other specified bacterial agents as the cause of diseases classified elsewhere: Secondary | ICD-10-CM

## 2016-12-14 MED ORDER — HYDROCODONE-HOMATROPINE 5-1.5 MG/5ML PO SYRP: ORAL_SOLUTION | ORAL | 0 refills | Status: DC

## 2016-12-14 MED ORDER — CEFDINIR 300 MG PO CAPS: 300.0000 mg | ORAL_CAPSULE | Freq: Two times a day (BID) | ORAL | 0 refills | Status: DC

## 2016-12-14 NOTE — Progress Notes (Signed)
   Subjective:    Patient ID: Katherine Davila, female    DOB: [birth year], 41 y.o.   MRN: [id number]  Cough  This is a new problem. The current episode started 1 to 4 weeks ago. Associated symptoms include a fever.   Patient states no other concerns this visit  Three wks duration  Cough and cong and dranage   Painful in the chest with clughing   Thick mucus and gunky at ties   Cough worse at night   Dim enrgy   Review of Systems  Constitutional: Positive for fever.  Respiratory: Positive for cough.        Objective:   Physical Exam Alert, mild malaise. Hydration good Vitals stable. frontal/ maxillary tenderness evident positive nasal congestion. pharynx normal neck supple  lungs clear/no crackles or wheezes. heart regular in rhythm        Assessment & Plan:  Impression rhinosinusitis likely post viral, discussed with patient. plan antibiotics prescribed. Questions answered. Symptomatic care discussed. warning signs discussed. WSL

## 2017-10-20 ENCOUNTER — Encounter: Payer: Self-pay | Admitting: Family Medicine

## 2017-10-20 ENCOUNTER — Ambulatory Visit (HOSPITAL_COMMUNITY)
Admission: RE | Admit: 2017-10-20 | Discharge: 2017-10-20 | Disposition: A | Discharge status: Home / Self Care | Payer: 59 | Source: Ambulatory Visit | Attending: Family Medicine | Admitting: Family Medicine

## 2017-10-20 ENCOUNTER — Telehealth: Payer: Self-pay | Admitting: Family Medicine

## 2017-10-20 ENCOUNTER — Ambulatory Visit (INDEPENDENT_AMBULATORY_CARE_PROVIDER_SITE_OTHER): Payer: 59 | Admitting: Family Medicine

## 2017-10-20 DIAGNOSIS — I6629 Occlusion and stenosis of unspecified posterior cerebral artery: Secondary | ICD-10-CM | POA: Insufficient documentation

## 2017-10-20 DIAGNOSIS — R51 Headache: Secondary | ICD-10-CM | POA: Insufficient documentation

## 2017-10-20 DIAGNOSIS — R519 Headache, unspecified: Secondary | ICD-10-CM

## 2017-10-20 MED ORDER — TRAMADOL HCL 50 MG PO TABS: ORAL_TABLET | ORAL | 0 refills | Status: DC

## 2017-10-20 MED ORDER — GADOBENATE DIMEGLUMINE 529 MG/ML IV SOLN
20.0000 mL | Freq: Once | INTRAVENOUS | Status: AC | PRN
  Administered 2017-10-20: 20 mL via INTRAVENOUS

## 2017-10-20 NOTE — Telephone Encounter (Signed)
Great news mri and mra all perfect no tumors no stroke no aneursys no blockage  This is likely a migraine variant or bad tension headache, re ck in office if persist  Can add short term ultram 50 mg one p o q four to six hrs prn pain , numb 24

## 2017-10-20 NOTE — Progress Notes (Signed)
   Subjective:    Patient ID: Katherine Davila A Davila, female    DOB: [birth year], 42 y.o.   MRN: [id number]  HPI Patient arrives with severe headaches with visual changes for 6 days.  Pain was sudden onset  Left temporoparietal  Day and niught   Unrelenting   Seem worse with straining  Wakes up with it  Left sided tenoirak headaches faurkt severe  Twice in one wk ha experiencd a blurriness of vision   Seems to be more focal in certain area of the visiua field    Gets   Headaches  Off and on takes tylenol and goes away   Fairly severe at times  tarted mid day about six d ago, spent the rest of the day in beroom with curtains puled  Feels nausea today , feel not well   Headache is steady in nture, vice grip like sensation  , not 100 per cent h a free over last six days   coughing or sneezing--pt states has not had to  Still feels day and night and when wakes up   Has not tried advil or ibu for headches '        Review of Systems No headache, no major weight loss or weight gain, no chest pain no back pain abdominal pain no change in bowel habits complete ROS otherwise negative     Objective:   Physical Exam  Alert and oriented, vitals reviewed and stable, patient in obvious discomfort ENT-TM's and ext canals WNL bilat via otoscopic exam Soft palate, tonsils and post pharynx WNL via oropharyngeal exam Neck-symmetric, no masses; thyroid nonpalpable and nontender Pulmonary-no tachypnea or accessory muscle use; Clear without wheezes via auscultation Card--no abnrml murmurs, rhythm reg and rate WNL Carotid pulses symmetric, without bruits No focal neurological deficits noted.    Impression severe headache.  Sudden onset.  Unilateral a day and night symptoms.  Worse with strain.  Patient very worried about it.  Worse headache she ever had in her life.  Long discussion held.  With level of severity and sudden onset and day and night features recommend substantial  evaluation.  Patient separate set MRI brain with contrast and stat MRA ordered.  Fortunately both these return normal.  Prescribed Ultram as needed for pain follow-up if persists        Assessment & Plan:

## 2017-10-20 NOTE — Telephone Encounter (Signed)
Results from MRI/MRA

## 2017-10-20 NOTE — Telephone Encounter (Signed)
I called [facility] and asked that they release the pt. I spoke with the patient and she is aware that imaging was normal does want the ultram called to [facility]. I sent the ultram in to requested pharmacy.

## 2018-08-20 ENCOUNTER — Telehealth: Payer: Self-pay | Admitting: Family Medicine

## 2018-08-20 NOTE — Telephone Encounter (Signed)
Pt's mom called, states she's collected the urine that Dr. Richardson Landry recommended last week  Not sure what to do with it.  Please advise

## 2018-08-20 NOTE — Telephone Encounter (Signed)
It's her daughter's urine sample. I made a message in her daughters chart.

## 2019-04-08 ENCOUNTER — Encounter: Payer: Self-pay | Admitting: Family Medicine

## 2019-06-14 ENCOUNTER — Other Ambulatory Visit: Payer: Self-pay

## 2019-06-14 ENCOUNTER — Ambulatory Visit (INDEPENDENT_AMBULATORY_CARE_PROVIDER_SITE_OTHER): Payer: BC Managed Care – PPO | Admitting: Nurse Practitioner

## 2019-06-14 DIAGNOSIS — F3160 Bipolar disorder, current episode mixed, unspecified: Secondary | ICD-10-CM | POA: Diagnosis not present

## 2019-06-14 MED ORDER — ARIPIPRAZOLE 5 MG PO TABS: 5.0000 mg | ORAL_TABLET | Freq: Every day | ORAL | 0 refills | Status: DC

## 2019-06-14 NOTE — Progress Notes (Signed)
PHONE VISIT Subjective:    Patient ID: Katherine Davila, female    DOB: Mar 05, 1976, 44 y.o.   MRN: [id number]  Anxiety Presents for initial visit. Symptoms include chest pain and irritability. Symptoms occur most days. Exacerbated by: stress. The quality of sleep is fair.     Patient states she has had this issue for quite sometime but has been able to control is.   Virtual Visit via Video Note  I connected with Katherine Davila on 06/14/19 at 10:20 AM EDT by a video enabled telemedicine application and verified that I am speaking with the correct person using two identifiers.  Location: Patient: home Provider: office   I discussed the limitations of evaluation and management by telemedicine and the availability of in person appointments. The patient expressed understanding and agreed to proceed.  History of Present Illness: GAD 7 : Generalized Anxiety Score 06/14/2019  Nervous, Anxious, on Edge 3  Control/stop worrying 3  Worry too much - different things 3  Trouble relaxing 2  Restless 3  Easily annoyed or irritable 3  Afraid - awful might happen 3  Total GAD 7 Score 20  Anxiety Difficulty Not difficult at all   Patient presents for complaints of a flareup of her mental health issues.  Has a longstanding history of bipolar disorder, has been well controlled on her own without medication for many years.  A review of her old record after her visit her last mental health admission was in 2012.  The record indicates she had over 70 suicidal ideation/attempts before that time.  Denies any current suicidal or homicidal thoughts or ideation.  Patient states she does not trust mental health due to her previous experience with recurrent hospitalizations and multiple medications which did not work and made her feel horrible.  Has noticed a progressive increase in her anxiety lately.  Is contacting us today to try to get the symptoms under control before they get worse.  Note patient is not  breast-feeding and has had a hysterectomy.  Takes a rare Benadryl for sleep.  Denies palpitations but feels a pounding pulse at times.  Has gained 40 pounds over the past year.  When asked about her previous medications, she stated that Abilify seemed to work better than anything that she could remember.  A review of the chart showed multiple inpatient medications.     Observations/Objective: Today's visit was via telephone Physical exam was not possible for this visit Alert, oriented.  Thoughts logical coherent and relevant.  Mildly anxious affect.  Assessment and Plan:   Problem List Items Addressed This Visit      Other   Bipolar disorder, mixed ([place]) - Primary   Relevant Orders   Ambulatory referral to Psychiatry     This diagnosis was consistent with most of her hospitalizations.  Follow Up Instructions: Urgent referral to psychiatry.  Due to patient's escalation in symptoms but no indication for hospitalization at this time, will restart Abilify low-dose.  Reviewed potential adverse effects.  DC medication and contact office if this worsens her current symptomatology.  Patient agrees to seek help immediately if she becomes suicidal or homicidal.  Follow-up here as needed until her mental health appointment.   I discussed the assessment and treatment plan with the patient. The patient was provided an opportunity to ask questions and all were answered. The patient agreed with the plan and demonstrated an understanding of the instructions.   The patient was advised to call back or seek an  in-person evaluation if the symptoms worsen or if the condition fails to improve as anticipated.  I provided 25 minutes of non-face-to-face time during this encounter.     Review of Systems  Constitutional: Positive for irritability.  Cardiovascular: Positive for chest pain.       Objective:   Physical Exam        Assessment & Plan:

## 2019-06-15 ENCOUNTER — Encounter: Payer: Self-pay | Admitting: Nurse Practitioner

## 2019-06-15 DIAGNOSIS — F3160 Bipolar disorder, current episode mixed, unspecified: Secondary | ICD-10-CM | POA: Insufficient documentation

## 2019-06-17 ENCOUNTER — Encounter: Payer: Self-pay | Admitting: Family Medicine

## 2019-06-25 ENCOUNTER — Telehealth (INDEPENDENT_AMBULATORY_CARE_PROVIDER_SITE_OTHER): Payer: BC Managed Care – PPO | Admitting: Family Medicine

## 2019-06-25 ENCOUNTER — Other Ambulatory Visit: Payer: Self-pay

## 2019-06-25 ENCOUNTER — Telehealth: Payer: Self-pay | Admitting: *Deleted

## 2019-06-25 DIAGNOSIS — R32 Unspecified urinary incontinence: Secondary | ICD-10-CM | POA: Diagnosis not present

## 2019-06-25 DIAGNOSIS — N3000 Acute cystitis without hematuria: Secondary | ICD-10-CM

## 2019-06-25 DIAGNOSIS — B9689 Other specified bacterial agents as the cause of diseases classified elsewhere: Secondary | ICD-10-CM

## 2019-06-25 MED ORDER — NITROFURANTOIN MONOHYD MACRO 100 MG PO CAPS: 100.0000 mg | ORAL_CAPSULE | Freq: Two times a day (BID) | ORAL | 0 refills | Status: DC

## 2019-06-25 MED ORDER — OXYBUTYNIN CHLORIDE ER 5 MG PO TB24: 5.0000 mg | ORAL_TABLET | Freq: Every day | ORAL | 5 refills | Status: DC

## 2019-06-25 NOTE — Progress Notes (Signed)
   Subjective:    Patient ID: Katherine Davila, female    DOB: 02/23/1976, 44 y.o.   MRN: [id number]  Urinary Tract Infection  This is a new problem. Episode onset: 2 days. There has been no fever. Associated symptoms comments: Dark urine, odor to urine, burning with urination and urgency to go.  would like to discuss starting back on oxybutynin for incontinence.  Patient frustrated about longstanding incontinence.  Both stress and urge.  Has had bladder instrumentation in the past after a challenging C-section.  Acute symptoms of UTI the last 2 days.  No fever no chills no vomiting  Virtual Visit via Telephone Note  I connected with Katherine Davila on 06/25/19 at  3:00 PM EDT by telephone and verified that I am speaking with the correct person using two identifiers.  Location: Patient: home Provider: office   I discussed the limitations, risks, security and privacy concerns of performing an evaluation and management service by telephone and the availability of in person appointments. I also discussed with the patient that there may be a patient responsible charge related to this service. The patient expressed understanding and agreed to proceed.   History of Present Illness:    Observations/Objective:   Assessment and Plan:   Follow Up Instructions:    I discussed the assessment and treatment plan with the patient. The patient was provided an opportunity to ask questions and all were answered. The patient agreed with the plan and demonstrated an understanding of the instructions.   The patient was advised to call back or seek an in-person evaluation if the symptoms worsen or if the condition fails to improve as anticipated.  I provided 22 minutes of non-face-to-face time during this encounter.     See above Review of Systems     Objective:   Physical Exam  Virtual      Assessment & Plan:  Impression 1 acute UTI  2.  Chronic mixed incontinence  Antibiotics  prescribed.  Warning signs discussed.  Resume oxybutynin.  Urology referral which was attempted to be initiated 2 years ago but held due to under insurance status

## 2019-06-25 NOTE — Telephone Encounter (Signed)
[name], bice are scheduled for a virtual visit with your provider today.    Just as we do with appointments in the office, we must obtain your consent to participate.  Your consent will be active for this visit and any virtual visit you may have with one of our providers in the next 365 days.    If you have a MyChart account, I can also send a copy of this consent to you electronically.  All virtual visits are billed to your insurance company just like a traditional visit in the office.  As this is a virtual visit, video technology does not allow for your provider to perform a traditional examination.  This may limit your provider's ability to fully assess your condition.  If your provider identifies any concerns that need to be evaluated in person or the need to arrange testing such as labs, EKG, etc, we will make arrangements to do so.    Although advances in technology are sophisticated, we cannot ensure that it will always work on either your end or our end.  If the connection with a video visit is poor, we may have to switch to a telephone visit.  With either a video or telephone visit, we are not always able to ensure that we have a secure connection.   I need to obtain your verbal consent now.   Are you willing to proceed with your visit today?   Katherine Davila has provided verbal consent on 06/25/2019 for a virtual visit (video or telephone).   Kyra Manges, LPN 11/26/1939  74:08 AM

## 2019-07-02 ENCOUNTER — Encounter: Payer: Self-pay | Admitting: Family Medicine

## 2019-07-17 ENCOUNTER — Ambulatory Visit: Payer: BC Managed Care – PPO | Admitting: Family Medicine

## 2019-07-17 ENCOUNTER — Encounter: Payer: Self-pay | Admitting: Family Medicine

## 2019-07-17 ENCOUNTER — Telehealth: Payer: Self-pay | Admitting: Family Medicine

## 2019-07-17 ENCOUNTER — Other Ambulatory Visit: Payer: Self-pay

## 2019-07-17 VITALS — BP 128/86 | HR 92 | Temp 97.7°F | Ht 68.0 in | Wt 260.0 lb

## 2019-07-17 DIAGNOSIS — N3000 Acute cystitis without hematuria: Secondary | ICD-10-CM

## 2019-07-17 MED ORDER — SULFAMETHOXAZOLE-TRIMETHOPRIM 800-160 MG PO TABS: 1.0000 | ORAL_TABLET | Freq: Two times a day (BID) | ORAL | 0 refills | Status: AC

## 2019-07-17 NOTE — Telephone Encounter (Signed)
Pt dropped off form to be able to donate plasma she is needing Eber Jones to fill this out. Form placed on Carolyn's desk.

## 2019-07-17 NOTE — Patient Instructions (Signed)
Kegel Exercises  Kegel exercises can help strengthen your pelvic floor muscles. The pelvic floor is a group of muscles that support your rectum, small intestine, and bladder. In females, pelvic floor muscles also help support the womb (uterus). These muscles help you control the flow of urine and stool. Kegel exercises are painless and simple, and they do not require any equipment. Your provider may suggest Kegel exercises to:  Improve bladder and bowel control.  Improve sexual response.  Improve weak pelvic floor muscles after surgery to remove the uterus (hysterectomy) or pregnancy (females).  Improve weak pelvic floor muscles after prostate gland removal or surgery (males). Kegel exercises involve squeezing your pelvic floor muscles, which are the same muscles you squeeze when you try to stop the flow of urine or keep from passing gas. The exercises can be done while sitting, standing, or lying down, but it is best to vary your position. Exercises How to do Kegel exercises: 1. Squeeze your pelvic floor muscles tight. You should feel a tight lift in your rectal area. If you are a female, you should also feel a tightness in your vaginal area. Keep your stomach, buttocks, and legs relaxed. 2. Hold the muscles tight for up to 10 seconds. 3. Breathe normally. 4. Relax your muscles. 5. Repeat as told by your health care provider. Repeat this exercise daily as told by your health care provider. Continue to do this exercise for at least 4-6 weeks, or for as long as told by your health care provider. You may be referred to a physical therapist who can help you learn more about how to do Kegel exercises. Depending on your condition, your health care provider may recommend:  Varying how long you squeeze your muscles.  Doing several sets of exercises every day.  Doing exercises for several weeks.  Making Kegel exercises a part of your regular exercise routine. This information is not intended  to replace advice given to you by your health care provider. Make sure you discuss any questions you have with your health care provider. Document Revised: 10/11/2017 Document Reviewed: 10/11/2017 Elsevier Patient Education  2020 [employer] Urinary Incontinence  Urinary incontinence refers to a condition in which a person is unable to control where and when to pass urine. A person with this condition will urinate when he or she does not mean to (involuntarily). What are the causes? This condition may be caused by:  Medicines.  Infections.  Constipation.  Overactive bladder muscles.  Weak bladder muscles.  Weak pelvic floor muscles. These muscles provide support for the bladder, intestine, and, in women, the uterus.  Enlarged prostate in men. The prostate is a gland near the bladder. When it gets too big, it can pinch the urethra. With the urethra blocked, the bladder can weaken and lose the ability to empty properly.  Surgery.  Emotional factors, such as anxiety, stress, or post-traumatic stress disorder (PTSD).  Pelvic organ prolapse. This happens in women when organs shift out of place and into the vagina. This shift can prevent the bladder and urethra from working properly. What increases the risk? The following factors may make you more likely to develop this condition:  Older age.  Obesity and physical inactivity.  Pregnancy and childbirth.  Menopause.  Diseases that affect the nerves or spinal cord (neurological diseases).  Long-term (chronic) coughing. This can increase pressure on the bladder and pelvic floor muscles. What are the signs or symptoms? Symptoms may vary depending on the type of urinary incontinence   you have. They include:  A sudden urge to urinate, but passing urine involuntarily before you can get to a bathroom (urge incontinence).  Suddenly passing urine with any activity that forces urine to pass, such as coughing, laughing, exercise, or  sneezing (stress incontinence).  Needing to urinate often, but urinating only a small amount, or constantly dribbling urine (overflow incontinence).  Urinating because you cannot get to the bathroom in time due to a physical disability, such as arthritis or injury, or communication and thinking problems, such as Alzheimer disease (functional incontinence). How is this diagnosed? This condition may be diagnosed based on:  Your medical history.  A physical exam.  Tests, such as: ? Urine tests. ? X-rays of your kidney and bladder. ? Ultrasound. ? CT scan. ? Cystoscopy. In this procedure, a health care provider inserts a tube with a light and camera (cystoscope) through the urethra and into the bladder in order to check for problems. ? Urodynamic testing. These tests assess how well the bladder, urethra, and sphincter can store and release urine. There are different types of urodynamic tests, and they vary depending on what the test is measuring. To help diagnose your condition, your health care provider may recommend that you keep a log of when you urinate and how much you urinate. How is this treated? Treatment for this condition depends on the type of incontinence that you have and its cause. Treatment may include:  Lifestyle changes, such as: ? Quitting smoking. ? Maintaining a healthy weight. ? Staying active. Try to get 150 minutes of moderate-intensity exercise every week. Ask your health care provider which activities are safe for you. ? Eating a healthy diet.  Avoid high-fat foods, like fried foods.  Avoid refined carbohydrates like white bread and white rice.  Limit how much alcohol and caffeine you drink.  Increase your fiber intake. Foods such as fresh fruits, vegetables, beans, and whole grains are healthy sources of fiber.  Pelvic floor muscle exercises.  Bladder training, such as lengthening the amount of time between bathroom breaks, or using the bathroom at regular  intervals.  Using techniques to suppress bladder urges. This can include distraction techniques or controlled breathing exercises.  Medicines to relax the bladder muscles and prevent bladder spasms.  Medicines to help slow or prevent the growth of a man's prostate.  Botox injections. These can help relax the bladder muscles.  Using pulses of electricity to help change bladder reflexes (electrical nerve stimulation).  For women, using a medical device to prevent urine leaks. This is a small, tampon-like, disposable device that is inserted into the urethra.  Injecting collagen or carbon beads (bulking agents) into the urinary sphincter. These can help thicken tissue and close the bladder opening.  Surgery. Follow these instructions at home: Lifestyle  Limit alcohol and caffeine. These can fill your bladder quickly and irritate it.  Keep yourself clean to help prevent odors and skin damage. Ask your doctor about special skin creams and cleansers that can protect the skin from urine.  Consider wearing pads or adult diapers. Make sure to change them regularly, and always change them right after experiencing incontinence. General instructions  Take over-the-counter and prescription medicines only as told by your health care provider.  Use the bathroom about every 3-4 hours, even if you do not feel the need to urinate. Try to empty your bladder completely every time. After urinating, wait a minute. Then try to urinate again.  Make sure you are in a relaxed position   while urinating.  If your incontinence is caused by nerve problems, keep a log of the medicines you take and the times you go to the bathroom.  Keep all follow-up visits as told by your health care provider. This is important. Contact a health care provider if:  You have pain that gets worse.  Your incontinence gets worse. Get help right away if:  You have a fever or chills.  You are unable to urinate.  You have  redness in your groin area or down your legs. Summary  Urinary incontinence refers to a condition in which a person is unable to control where and when to pass urine.  This condition may be caused by medicines, infection, weak bladder muscles, weak pelvic floor muscles, enlargement of the prostate (in men), or surgery.  The following factors increase your risk for developing this condition: older age, obesity, pregnancy and childbirth, menopause, neurological diseases, and chronic coughing.  There are several types of urinary incontinence. They include urge incontinence, stress incontinence, overflow incontinence, and functional incontinence.  This condition is usually treated first with lifestyle and behavioral changes, such as quitting smoking, eating a healthier diet, and doing regular pelvic floor exercises. Other treatment options include medicines, bulking agents, medical devices, electrical nerve stimulation, or surgery. This information is not intended to replace advice given to you by your health care provider. Make sure you discuss any questions you have with your health care provider. Document Revised: 03/03/2017 Document Reviewed: 06/02/2016 Elsevier Patient Education  2020 [employer] Inc.  

## 2019-07-17 NOTE — Progress Notes (Signed)
Patient ID: Katherine Davila, female    DOB: 1975/03/12, 44 y.o.   MRN: [id number]   Chief Complaint  Patient presents with  . Urinary Tract Infection   Subjective:    HPI  CC- dysuria. Pt having pain with urination and frequency for 3 days. Tried azo, w/o much relief.  Had UTI 3 wks ago and treated by pcp.  No fever or back pain. Having mild lower abd pain. Seeing dark urine with strong odor. No h/o frequent utis. Usually getting 1-2 per yr.   Has long history of incontinence that is mixed and just restarted on oxybutinin on last visit 3 wks ago. Also had uti at that time and was treated for suspected uti with macrobid.  Doing well till 3 days ago when symptoms returned. Seeing urology in 6/21 for incontinence issues.   Medical History Satcha has a past medical history of Abnormal Pap smear, Asthma, Complication of anesthesia (2011), Contraceptive management (08/26/2013), migraines, No pertinent past medical history, and Vaginal Pap smear, abnormal.   Outpatient Encounter Medications as of 07/17/2019  Medication Sig  . ARIPiprazole (ABILIFY) 5 MG tablet Take 1 tablet (5 mg total) by mouth daily. At bedtime  . diphenhydrAMINE (BENADRYL) 25 MG tablet Take 25 mg by mouth as needed.  Marland Kitchen oxybutynin (DITROPAN-XL) 5 MG 24 hr tablet Take 1 tablet (5 mg total) by mouth at bedtime.  Marland Kitchen oxybutynin (DITROPAN-XL) 5 MG 24 hr tablet Take 1 tablet (5 mg total) by mouth at bedtime.  . traMADol (ULTRAM) 50 MG tablet One tablet po Q 4-6 hours prn pain  . sulfamethoxazole-trimethoprim (BACTRIM DS) 800-160 MG tablet Take 1 tablet by mouth 2 (two) times daily for 5 days.  . [DISCONTINUED] nitrofurantoin, macrocrystal-monohydrate, (MACROBID) 100 MG capsule Take 1 capsule (100 mg total) by mouth 2 (two) times daily.   No facility-administered encounter medications on file as of 07/17/2019.     Review of Systems  Constitutional: Negative for chills and fever.  Gastrointestinal: Positive for abdominal  pain. Negative for constipation, diarrhea, nausea and vomiting.  Genitourinary: Positive for dysuria, frequency and urgency. Negative for difficulty urinating, enuresis, flank pain, hematuria, pelvic pain, vaginal bleeding, vaginal discharge and vaginal pain.  Musculoskeletal: Negative for back pain.  Skin: Negative for rash.     Vitals BP 128/86   Pulse 92   Temp 97.7 F (36.5 C)   Ht 5\' 8"  (1.727 m)   Wt 260 lb (117.9 kg)   LMP 08/22/2013   SpO2 97%   BMI 39.53 kg/m   Objective:   Physical Exam Vitals and nursing note reviewed.  Constitutional:      General: She is not in acute distress.    Appearance: Normal appearance. She is not ill-appearing.  Cardiovascular:     Rate and Rhythm: Normal rate and regular rhythm.     Pulses: Normal pulses.     Heart sounds: Normal heart sounds.  Pulmonary:     Effort: Pulmonary effort is normal. No respiratory distress.     Breath sounds: Normal breath sounds.  Abdominal:     General: Bowel sounds are normal. There is no distension.     Palpations: Abdomen is soft. There is no mass.     Tenderness: There is no abdominal tenderness. There is no right CVA tenderness, left CVA tenderness, guarding or rebound.     Hernia: No hernia is present.  Skin:    General: Skin is warm and dry.     Findings: No lesion or  rash.  Neurological:     General: No focal deficit present.     Mental Status: She is alert and oriented to person, place, and time.  Psychiatric:        Mood and Affect: Mood normal.        Behavior: Behavior normal.      Assessment and Plan   1. Acute cystitis without hematuria - Urine Culture - sulfamethoxazole-trimethoprim (BACTRIM DS) 800-160 MG tablet; Take 1 tablet by mouth 2 (two) times daily for 5 days.  Dispense: 10 tablet; Refill: 0   Urine dip unable to obtain due to Azo. Micro- mod-bacteria, few-wbc.   Treated with bactrim for 5 days.  Will culture urine.  F/u prn.

## 2019-07-18 ENCOUNTER — Other Ambulatory Visit: Payer: Self-pay | Admitting: Nurse Practitioner

## 2019-07-18 NOTE — Telephone Encounter (Signed)
Noted  

## 2019-07-18 NOTE — Telephone Encounter (Signed)
Please check to be sure she is stable. Also, do we have plans in the works for psychiatry. I can refill the medication if needed.

## 2019-07-18 NOTE — Telephone Encounter (Signed)
Pt notified, form faxed and pt will pick up original.

## 2019-07-23 LAB — SPECIMEN STATUS REPORT

## 2019-07-23 LAB — URINE CULTURE

## 2019-07-25 ENCOUNTER — Telehealth: Payer: Self-pay | Admitting: Family Medicine

## 2019-07-25 ENCOUNTER — Encounter: Payer: Self-pay | Admitting: *Deleted

## 2019-07-25 NOTE — Telephone Encounter (Signed)
lmtc

## 2019-07-25 NOTE — Telephone Encounter (Signed)
Pt had Eber Jones fill out form so she could donate plasma and listed on the form it states pt has asthma. Pt states she does not have asthma and wants to know if it is in her chart that she does.

## 2019-07-25 NOTE — Telephone Encounter (Signed)
Pt returned call. Pt is wanting to know if she had asthma on her list. Looked in chart and there is no asthma listed. Pt was able to donate.

## 2019-08-26 DIAGNOSIS — F331 Major depressive disorder, recurrent, moderate: Secondary | ICD-10-CM | POA: Diagnosis not present

## 2019-08-28 ENCOUNTER — Ambulatory Visit: Payer: 59 | Admitting: Urology

## 2019-10-03 ENCOUNTER — Other Ambulatory Visit: Payer: Self-pay

## 2019-10-03 ENCOUNTER — Ambulatory Visit (INDEPENDENT_AMBULATORY_CARE_PROVIDER_SITE_OTHER): Payer: BC Managed Care – PPO | Admitting: Urology

## 2019-10-03 ENCOUNTER — Encounter: Payer: Self-pay | Admitting: Urology

## 2019-10-03 VITALS — BP 126/84 | HR 80 | Temp 99.1°F | Ht 67.0 in | Wt 260.0 lb

## 2019-10-03 DIAGNOSIS — R32 Unspecified urinary incontinence: Secondary | ICD-10-CM | POA: Diagnosis not present

## 2019-10-03 LAB — BLADDER SCAN AMB NON-IMAGING: Scan Result: 32.2

## 2019-10-03 NOTE — Progress Notes (Signed)
Urological Symptom Review  Patient is experiencing the following symptoms: Frequent urination Leakage of urine Urinary tract infection   Review of Systems  Gastrointestinal (upper)  : Negative for upper GI symptoms  Gastrointestinal (lower) : Negative for lower GI symptoms  Constitutional : negative  Skin: Negative for skin symptoms  Eyes: Negative for eye symptoms  Ear/Nose/Throat : Negative for Ear/Nose/Throat symptoms  Hematologic/Lymphatic: None  Cardiovascular : Leg swelling  Respiratory : Negative for respiratory symptoms  Endocrine: Negative for endocrine symptoms  Musculoskeletal: Negative for musculoskeletal symptoms  Neurological: Negative for neurological symptoms  Psychologic: Negative for psychiatric symptoms

## 2019-10-03 NOTE — Patient Instructions (Signed)

## 2019-10-03 NOTE — Progress Notes (Signed)
10/03/2019 10:08 AM   Katherine Davila [birth year] [id number]  Referring provider: Merlyn Albert, MD No address on file  Urinary incontinence  HPI: Katherine Davila is a 43yo here for evaluation of urinary incontinence. G3P3 She started having mild SUI 10 years ago which has become progressively worse. 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  She uses 2 pads per day which are soaked. She uses oxybutynin daily which improves the incontinence.  She has urge urinary incontinence 2x per week.    PMH: Past Medical History:  Diagnosis Date  . Abnormal Pap smear   . Complication of anesthesia 2011   two attempts at spinal with last preg  . Contraceptive management 08/26/2013  . Hx of migraines   . No pertinent past medical history   . Vaginal Pap smear, abnormal     Surgical History: Past Surgical History:  Procedure Laterality Date  . ABDOMINAL HYSTERECTOMY    . APPENDECTOMY    . CESAREAN SECTION    . CESAREAN SECTION  04/06/2012   Procedure: CESAREAN SECTION;  Surgeon: Lazaro Arms, MD;  Location: [facility];  Service: Obstetrics;  Laterality: N/A;  prev c/s  . cystotomy N/A    cystotomy repari  . LAPAROSCOPIC SALPINGOOPHERECTOMY     right  . WISDOM TOOTH EXTRACTION      Home Medications:  Allergies as of 10/03/2019      Reactions   Erythromycin Nausea Only   Penicillins Nausea Only      Medication List       Accurate as of October 03, 2019 10:08 AM. If you have any questions, ask your nurse or doctor.        ARIPiprazole 5 MG tablet Commonly known as: ABILIFY TAKE (1) TABLET BY MOUTH AT BEDTIME.   diphenhydrAMINE 25 MG tablet Commonly known as: BENADRYL Take 25 mg by mouth as needed.   oxybutynin 5 MG 24 hr tablet Commonly known as: DITROPAN-XL Take 1 tablet (5 mg total) by mouth at bedtime. What changed: Another medication with the same name was removed. Continue taking this  medication, and follow the directions you see here. Changed by: Wilkie Aye, MD   traMADol 50 MG tablet Commonly known as: ULTRAM One tablet po Q 4-6 hours prn pain       Allergies:  Allergies  Allergen Reactions  . Erythromycin Nausea Only  . Penicillins Nausea Only    Family History: Family History  Problem Relation Age of Onset  . Cancer Father        head, neck  . Hypertension Father   . Dementia Maternal Grandmother   . Heart disease Maternal Grandmother   . Hypertension Maternal Grandmother   . Dementia Paternal Grandmother     Social History:  reports that she has never smoked. She has never used smokeless tobacco. She reports that she does not drink alcohol and does not use drugs.  ROS: All other review of systems were reviewed and are negative except what is noted above in HPI  Physical Exam: BP 126/84   Pulse 80   Temp 99.1 F (37.3 C)   Ht 5\' 7"  (1.702 m)   Wt (!) 260 lb (117.9 kg)   LMP 08/22/2013   BMI 40.72 kg/m   Constitutional:  Alert and oriented, No acute distress. HEENT: [place] AT, moist mucus membranes.  Trachea midline, no masses. Cardiovascular: No clubbing, cyanosis, or edema. Respiratory: Normal respiratory  effort, no increased work of breathing. GI: Abdomen is soft, nontender, nondistended, no abdominal masses GU: No CVA tenderness.  Lymph: No cervical or inguinal lymphadenopathy. Skin: No rashes, bruises or suspicious lesions. Neurologic: Grossly intact, no focal deficits, moving all 4 extremities. Psychiatric: Normal mood and affect.  Laboratory Data: Lab Results  Component Value Date   WBC 9.8 04/11/2014   HGB 13.6 04/11/2014   HCT 40.3 04/11/2014   MCV 98 (H) 04/11/2014   PLT 244 04/11/2014    Lab Results  Component Value Date   CREATININE 0.60 01/22/2014    No results found for: PSA  No results found for: TESTOSTERONE  No results found for: HGBA1C  Urinalysis    Component Value Date/Time   COLORURINE YELLOW  11/28/2013 1436   APPEARANCEUR CLEAR 11/28/2013 1436   LABSPEC 1.020 11/28/2013 1436   PHURINE 7.0 11/28/2013 1436   GLUCOSEU NEG 11/28/2013 1436   HGBUR NEG 11/28/2013 1436   BILIRUBINUR NEG 11/28/2013 1436   KETONESUR NEG 11/28/2013 1436   PROTEINUR neg 05/29/2014 0954   PROTEINUR NEG 11/28/2013 1436   UROBILINOGEN 0.2 11/28/2013 1436   NITRITE neg 05/29/2014 0954   NITRITE NEG 11/28/2013 1436   LEUKOCYTESUR Negative 05/29/2014 0954    No results found for: LABMICR, WBCUA, RBCUA, LABEPIT, MUCUS, BACTERIA  Pertinent Imaging:  No results found for this or any previous visit.  No results found for this or any previous visit.  No results found for this or any previous visit.  No results found for this or any previous visit.  No results found for this or any previous visit.  No results found for this or any previous visit.  No results found for this or any previous visit.  No results found for this or any previous visit.   Assessment & Plan:    1. Urinary incontinence, unspecified type -We will obtain CT abd/pelvis due to hx of bladder and ureter reconstruction followed by cystoscopy - Urinalysis, Routine w reflex microscopic - BLADDER SCAN AMB NON-IMAGING   No follow-ups on file.  Wilkie Aye, MD  [facility] [place]

## 2019-10-04 LAB — BASIC METABOLIC PANEL
BUN/Creatinine Ratio: 11 (ref 9–23)
BUN: 10 mg/dL (ref 6–24)
CO2: 24 mmol/L (ref 20–29)
Calcium: 9.2 mg/dL (ref 8.7–10.2)
Chloride: 101 mmol/L (ref 96–106)
Creatinine, Ser: 0.89 mg/dL (ref 0.57–1.00)
GFR calc Af Amer: 93 mL/min/{1.73_m2} (ref >59)
GFR calc non Af Amer: 81 mL/min/{1.73_m2} (ref >59)
Glucose: 91 mg/dL (ref 65–99)
Potassium: 4 mmol/L (ref 3.5–5.2)
Sodium: 138 mmol/L (ref 134–144)

## 2019-10-15 ENCOUNTER — Encounter: Payer: Self-pay | Admitting: Urology

## 2019-10-16 LAB — URINALYSIS, ROUTINE W REFLEX MICROSCOPIC
Bilirubin, UA: NEGATIVE
Glucose, UA: NEGATIVE
Ketones, UA: NEGATIVE
Leukocytes,UA: NEGATIVE
Nitrite, UA: NEGATIVE
RBC, UA: NEGATIVE
Specific Gravity, UA: 1.015 (ref 1.005–1.030)
Urobilinogen, Ur: 0.2 mg/dL (ref 0.2–1.0)
pH, UA: >9 — ABNORMAL HIGH (ref 5.0–7.5)

## 2019-10-28 ENCOUNTER — Other Ambulatory Visit (HOSPITAL_COMMUNITY): Payer: BC Managed Care – PPO

## 2019-10-30 ENCOUNTER — Telehealth: Payer: Self-pay | Admitting: Family Medicine

## 2019-10-30 MED ORDER — SCOPOLAMINE 1 MG/3DAYS TD PT72: 1.0000 | MEDICATED_PATCH | TRANSDERMAL | 0 refills | Status: DC

## 2019-10-30 NOTE — Telephone Encounter (Signed)
Patient is going on a cruise next week and would like sea sickness patches called in.  She said she has had them before.   [facility]

## 2019-11-06 ENCOUNTER — Other Ambulatory Visit: Payer: BC Managed Care – PPO | Admitting: Urology

## 2019-11-06 ENCOUNTER — Ambulatory Visit (HOSPITAL_COMMUNITY): Payer: BC Managed Care – PPO

## 2019-11-06 DIAGNOSIS — R32 Unspecified urinary incontinence: Secondary | ICD-10-CM

## 2019-11-06 DIAGNOSIS — Z03818 Encounter for observation for suspected exposure to other biological agents ruled out: Secondary | ICD-10-CM | POA: Diagnosis not present

## 2019-11-06 DIAGNOSIS — Z20822 Contact with and (suspected) exposure to covid-19: Secondary | ICD-10-CM | POA: Diagnosis not present

## 2019-11-29 ENCOUNTER — Other Ambulatory Visit: Payer: Self-pay

## 2019-11-29 ENCOUNTER — Ambulatory Visit (HOSPITAL_COMMUNITY)
Admission: RE | Admit: 2019-11-29 | Discharge: 2019-11-29 | Disposition: A | Discharge status: Home / Self Care | Payer: BC Managed Care – PPO | Source: Ambulatory Visit | Attending: Urology | Admitting: Urology

## 2019-11-29 ENCOUNTER — Encounter (HOSPITAL_COMMUNITY): Payer: Self-pay

## 2019-11-29 DIAGNOSIS — R32 Unspecified urinary incontinence: Secondary | ICD-10-CM | POA: Diagnosis not present

## 2019-11-29 DIAGNOSIS — K760 Fatty (change of) liver, not elsewhere classified: Secondary | ICD-10-CM | POA: Diagnosis not present

## 2019-11-29 MED ORDER — IOHEXOL 300 MG/ML  SOLN
150.0000 mL | Freq: Once | INTRAMUSCULAR | Status: AC | PRN
  Administered 2019-11-29: 150 mL via INTRAVENOUS

## 2019-12-11 ENCOUNTER — Ambulatory Visit (INDEPENDENT_AMBULATORY_CARE_PROVIDER_SITE_OTHER): Payer: BC Managed Care – PPO | Admitting: Urology

## 2019-12-11 ENCOUNTER — Other Ambulatory Visit: Payer: Self-pay

## 2019-12-11 ENCOUNTER — Encounter: Payer: Self-pay | Admitting: Urology

## 2019-12-11 VITALS — BP 149/91 | HR 99 | Temp 98.7°F | Ht 67.0 in | Wt 260.0 lb

## 2019-12-11 DIAGNOSIS — R32 Unspecified urinary incontinence: Secondary | ICD-10-CM

## 2019-12-11 LAB — URINALYSIS, ROUTINE W REFLEX MICROSCOPIC
Bilirubin, UA: NEGATIVE
Glucose, UA: NEGATIVE
Ketones, UA: NEGATIVE
Leukocytes,UA: NEGATIVE
Nitrite, UA: NEGATIVE
Protein,UA: NEGATIVE
RBC, UA: NEGATIVE
Specific Gravity, UA: 1.01 (ref 1.005–1.030)
Urobilinogen, Ur: 0.2 mg/dL (ref 0.2–1.0)
pH, UA: 5.5 (ref 5.0–7.5)

## 2019-12-11 MED ORDER — MIRABEGRON ER 50 MG PO TB24: 50.0000 mg | ORAL_TABLET | Freq: Every day | ORAL | 0 refills | Status: DC

## 2019-12-11 MED ORDER — CIPROFLOXACIN HCL 500 MG PO TABS
500.0000 mg | ORAL_TABLET | Freq: Once | ORAL | Status: AC
  Administered 2019-12-11: 500 mg via ORAL

## 2019-12-11 NOTE — Patient Instructions (Signed)

## 2019-12-11 NOTE — Progress Notes (Signed)
Urological Symptom Review  Patient is experiencing the following symptoms: Leakage of urine   Review of Systems  Gastrointestinal (upper)  : Negative for upper GI symptoms  Gastrointestinal (lower) : Negative for lower GI symptoms  Constitutional : Negative for symptoms  Skin: Negative for skin symptoms  Eyes: Negative for eye symptoms  Ear/Nose/Throat : Negative for Ear/Nose/Throat symptoms  Hematologic/Lymphatic: Negative for Hematologic/Lymphatic symptoms  Cardiovascular : Negative for cardiovascular symptoms  Respiratory : Negative for respiratory symptoms  Endocrine: Negative for endocrine symptoms  Musculoskeletal: Negative for musculoskeletal symptoms  Neurological: Negative for neurological symptoms  Psychologic: Negative for psychiatric symptoms  

## 2019-12-11 NOTE — Progress Notes (Signed)
12/11/2019 12:17 PM   Katherine Davila 28-Mar-1975 [id number]  Referring provider: Annalee Genta, DO [address] [place],  [place] [address]  Urinary incontinence  HPI: Katherine Davila is a 44yo here for followup for urinary incontinence. Her incontinence is slightly improved with mirabegron 25mg . She continues to have daily urgency and urge incontinence. CT hematuria protocol showed a right bladder defect consistent with bladder resection and ureteral reimplant. NO other LUTS    PMH: Past Medical History:  Diagnosis Date  . Abnormal Pap smear   . Complication of anesthesia 2011   two attempts at spinal with last preg  . Contraceptive management 08/26/2013  . Hx of migraines   . No pertinent past medical history   . Vaginal Pap smear, abnormal     Surgical History: Past Surgical History:  Procedure Laterality Date  . ABDOMINAL HYSTERECTOMY    . APPENDECTOMY    . CESAREAN SECTION    . CESAREAN SECTION  04/06/2012   Procedure: CESAREAN SECTION;  Surgeon: 04/08/2012, MD;  Location: [facility];  Service: Obstetrics;  Laterality: N/A;  prev c/s  . cystotomy N/A    cystotomy repari  . LAPAROSCOPIC SALPINGOOPHERECTOMY     right  . WISDOM TOOTH EXTRACTION      Home Medications:  Allergies as of 12/11/2019      Reactions   Erythromycin Nausea Only   Penicillins Nausea Only   Iohexol Nausea And Vomiting   Pt has excessive vomiting with contrast      Medication List       Accurate as of December 11, 2019 12:17 PM. If you have any questions, ask your nurse or doctor.        STOP taking these medications   oxybutynin 5 MG 24 hr tablet Commonly known as: DITROPAN-XL Stopped by: December 13, 2019, MD     TAKE these medications   ARIPiprazole 5 MG tablet Commonly known as: ABILIFY TAKE (1) TABLET BY MOUTH AT BEDTIME.   diphenhydrAMINE 25 MG tablet Commonly known as: BENADRYL Take 25 mg by mouth as needed.   mirabegron ER 50 MG Tb24 tablet Commonly known as:  MYRBETRIQ Take 1 tablet (50 mg total) by mouth daily. Started by: Katherine Aye, MD   scopolamine 1 MG/3DAYS Commonly known as: Transderm-Scop (1.5 MG) Place 1 patch (1.5 mg total) onto the skin every 3 (three) days.   traMADol 50 MG tablet Commonly known as: ULTRAM One tablet po Q 4-6 hours prn pain       Allergies:  Allergies  Allergen Reactions  . Erythromycin Nausea Only  . Penicillins Nausea Only  . Iohexol Nausea And Vomiting    Pt has excessive vomiting with contrast    Family History: Family History  Problem Relation Age of Onset  . Cancer Father        head, neck  . Hypertension Father   . Dementia Maternal Grandmother   . Heart disease Maternal Grandmother   . Hypertension Maternal Grandmother   . Dementia Paternal Grandmother     Social History:  reports that she has never smoked. She has never used smokeless tobacco. She reports that she does not drink alcohol and does not use drugs.  ROS: All other review of systems were reviewed and are negative except what is noted above in HPI  Physical Exam: BP (!) 149/91   Pulse 99   Temp 98.7 F (37.1 C)   Ht 5\' 7"  (1.702 m)   Wt 260 lb (117.9 kg)  LMP 08/22/2013   BMI 40.72 kg/m   Constitutional:  Alert and oriented, No acute distress. HEENT: [place] AT, moist mucus membranes.  Trachea midline, no masses. Cardiovascular: No clubbing, cyanosis, or edema. Respiratory: Normal respiratory effort, no increased work of breathing. GI: Abdomen is soft, nontender, nondistended, no abdominal masses GU: No CVA tenderness.  Lymph: No cervical or inguinal lymphadenopathy. Skin: No rashes, bruises or suspicious lesions. Neurologic: Grossly intact, no focal deficits, moving all 4 extremities. Psychiatric: Normal mood and affect.  Laboratory Data: Lab Results  Component Value Date   WBC 9.8 04/11/2014   HGB 13.6 04/11/2014   HCT 40.3 04/11/2014   MCV 98 (H) 04/11/2014   PLT 244 04/11/2014    Lab Results   Component Value Date   CREATININE 0.89 10/03/2019    No results found for: PSA  No results found for: TESTOSTERONE  No results found for: HGBA1C  Urinalysis    Component Value Date/Time   COLORURINE YELLOW 11/28/2013 1436   APPEARANCEUR Clear 12/11/2019 1149   LABSPEC 1.020 11/28/2013 1436   PHURINE 7.0 11/28/2013 1436   GLUCOSEU Negative 12/11/2019 1149   HGBUR NEG 11/28/2013 1436   BILIRUBINUR Negative 12/11/2019 1149   KETONESUR NEG 11/28/2013 1436   PROTEINUR Negative 12/11/2019 1149   PROTEINUR NEG 11/28/2013 1436   UROBILINOGEN 0.2 11/28/2013 1436   NITRITE Negative 12/11/2019 1149   NITRITE NEG 11/28/2013 1436   LEUKOCYTESUR Negative 12/11/2019 1149    Lab Results  Component Value Date   LABMICR Comment 12/11/2019    Pertinent Imaging: Ct Hematuria 11/29/2019: Images reviewed and discussed with the patient No results found for this or any previous visit.  No results found for this or any previous visit.  No results found for this or any previous visit.  No results found for this or any previous visit.  No results found for this or any previous visit.  No results found for this or any previous visit.  Results for orders placed during the hospital encounter of 11/29/19  CT HEMATURIA WORKUP  Narrative CLINICAL DATA:  History of urinary incontinence. History of placental abnormality that a cyst stated partial bladder resection and ureteral implantation.  EXAM: CT ABDOMEN AND PELVIS WITH CONTRAST  TECHNIQUE: Multidetector CT imaging of the abdomen and pelvis was performed following standard protocol after the administration of intravenous contrast. Small amount of contrast was administered, approximately 50 cc prior to acquisition of noncontrast imaging. Given young age of the patient split bolus protocol without baseline could be considered; however, the patient experienced marked nausea and vomiting which lasted several minutes after the  administration of the intravenous contrast. For this reason only 50 cc of contrast was administered.  CONTRAST:  OMNIPAQUE IOHEXOL 300 MG/ML  SOLN  COMPARISON:  MRI of the pelvis from 2016  FINDINGS: Lower chest: Lung bases are clear. No consolidation. No pleural effusion.  Hepatobiliary: Hepatic steatosis suggested, nonspecific on this late venous phase, delayed phase evaluation. No pericholecystic stranding. No gross biliary duct dilation.  Pancreas: Pancreas is normal.  Spleen: Spleen normal size and contour.  Adrenals/Urinary Tract: Adrenal glands are normal.  No hydronephrosis. No ureteral dilation. Distal RIGHT ureter with limited assessment. Mild distortion of the bladder base may reflect postoperative changes related to given history of partial bladder resection and ureteral implantation though RIGHT ureteral insertion appears orthotopic.  LEFT ureter without sign of filling defect or secondary sign of stricture. Assessment for renal calculi would be difficult given the presence of excreted contrast in  the lack of baseline imaging, no definite calculi are seen accounting for these limitations.  Stomach/Bowel: No acute gastrointestinal process. Post appendectomy.  Vascular/Lymphatic: Normal caliber abdominal aorta. No atherosclerotic calcifications. There is no gastrohepatic or hepatoduodenal ligament lymphadenopathy. No retroperitoneal or mesenteric lymphadenopathy.  No pelvic sidewall lymphadenopathy.  Reproductive: Post hysterectomy.  Other: Midline postoperative changes. No abdominal wall hernia. No ascites.  Musculoskeletal: No acute musculoskeletal process. No destructive bone finding.  IMPRESSION: 1. Mild distortion of the RIGHT bladder base may reflect postoperative changes related to given history of partial bladder resection and ureteral implantation though ureteral insertion appears orthotopic. Limited study though given patient history  and contrast intolerance. The patient may benefit from a dynamic assessment of the pelvic floor with MRI to assess for any signs of pelvic floor dysfunction. A tailored examination could be performed after consultation as warranted. 2. Correlate with any history of hematuria or other symptoms that would necessitate further evaluation of the urothelium. 3. Hepatic steatosis suggested, nonspecific on this late venous phase, delayed phase evaluation.   Electronically Signed By: Donzetta Kohut M.D. On: 11/29/2019 13:36  No results found for this or any previous visit.   Assessment & Plan:    1. Urinary incontinence, unspecified type -We will increase mirabegron to 50mg  daily - Urinalysis, Routine w reflex microscopic - ciprofloxacin (CIPRO) tablet 500 mg   Return in about 4 weeks (around 01/08/2020).  13/05/2019, MD  [facility] [place]     Blood pressure (!) 149/91, pulse 99, temperature 98.7 F (37.1 C), height 5\' 7"  (1.702 m), weight 260 lb (117.9 kg), last menstrual period 08/22/2013, unknown if currently breastfeeding. NED. A&Ox3.   No respiratory distress   Abd soft, NT, ND Normal external genitalia with patent urethral meatus  Cystoscopy Procedure Note  Patient identification was confirmed, informed consent was obtained, and patient was prepped using Betadine solution.  Lidocaine jelly was administered per urethral meatus.    Procedure: - Flexible cystoscope introduced, without any difficulty.   - Thorough search of the bladder revealed:    normal urethral meatus    normal urothelium    no stones    no ulcers     no tumors    no urethral polyps    no trabeculation  - Ureteral orifices were normal in position and appearance.  Post-Procedure: - Patient tolerated the procedure well  Assessment/ Plan:    Return in about 4 weeks (around 01/08/2020).  08/24/2013, MD

## 2019-12-20 ENCOUNTER — Other Ambulatory Visit: Payer: Self-pay

## 2019-12-20 DIAGNOSIS — R32 Unspecified urinary incontinence: Secondary | ICD-10-CM

## 2019-12-20 MED ORDER — MIRABEGRON ER 50 MG PO TB24: 50.0000 mg | ORAL_TABLET | Freq: Every day | ORAL | 10 refills | Status: DC

## 2020-01-02 ENCOUNTER — Other Ambulatory Visit: Payer: Self-pay

## 2020-01-02 ENCOUNTER — Telehealth: Payer: Self-pay | Admitting: Urology

## 2020-01-02 DIAGNOSIS — R32 Unspecified urinary incontinence: Secondary | ICD-10-CM

## 2020-01-02 MED ORDER — MIRABEGRON ER 50 MG PO TB24: 50.0000 mg | ORAL_TABLET | Freq: Every day | ORAL | 10 refills | Status: DC

## 2020-01-02 NOTE — Telephone Encounter (Signed)
Rx resubmitted to [facility].

## 2020-01-02 NOTE — Telephone Encounter (Signed)
Patient came by and stated Myrbetriq was not at [facility]. I see we sent it in on 12/20/19. I called pharmacy to double check and Aneta Mins stated it is not in the system. Patient asked if we can call pharmacy.

## 2020-01-09 ENCOUNTER — Ambulatory Visit: Payer: BC Managed Care – PPO | Admitting: Urology

## 2020-01-22 ENCOUNTER — Other Ambulatory Visit: Payer: Self-pay

## 2020-01-22 ENCOUNTER — Ambulatory Visit: Payer: BC Managed Care – PPO | Admitting: Family Medicine

## 2020-01-22 ENCOUNTER — Encounter: Payer: Self-pay | Admitting: Family Medicine

## 2020-01-22 VITALS — BP 130/94 | HR 95 | Temp 98.1°F

## 2020-01-22 DIAGNOSIS — S335XXA Sprain of ligaments of lumbar spine, initial encounter: Secondary | ICD-10-CM

## 2020-01-22 DIAGNOSIS — M545 Low back pain, unspecified: Secondary | ICD-10-CM

## 2020-01-22 DIAGNOSIS — S335XXD Sprain of ligaments of lumbar spine, subsequent encounter: Secondary | ICD-10-CM | POA: Insufficient documentation

## 2020-01-22 MED ORDER — CYCLOBENZAPRINE HCL 10 MG PO TABS: 10.0000 mg | ORAL_TABLET | Freq: Three times a day (TID) | ORAL | 1 refills | Status: DC | PRN

## 2020-01-22 MED ORDER — DICLOFENAC SODIUM 75 MG PO TBEC: 75.0000 mg | DELAYED_RELEASE_TABLET | Freq: Two times a day (BID) | ORAL | 1 refills | Status: DC

## 2020-01-22 NOTE — Progress Notes (Signed)
Patient ID: Katherine Davila, female    DOB: Jun 24, 1975, 44 y.o.   MRN: [id number]   Chief Complaint  Patient presents with   Back Pain    Patient reports doing some wood working last week and felt like she had a catch in her back, felt like her hip needed to pop and proceeded to move her legs and stretch. Patient unable to get comfortable, severe pain causing nausea/vomiting. Right lower back down to right knee. taking ibuprofen.    Subjective:  CC: right side back pain  This is a new problem.  Presents today with a complaint of right hip pain.  She was doing some woodworking on Wednesday or Thursday, grabbed a piece of wood, things went wrong and she twisted her back.  Everything happened so quickly she is unsure of the exact mechanism of injury.  Location of the pain is in her right hip/buttock area says it feels like "spasms "she does say the pain goes down the right side of her body on her leg, no electrical type pain no numbness no tingling no weakness.  She has tried ibuprofen 800 mg 3 times a day, heat and topical rubs.  Has not tried ice.  Has rested the area since the injury.  Denies fever chills chest pain and shortness of breath.  The pain is moderately severe.    Medical History Katherine has a past medical history of Abnormal Pap smear, Complication of anesthesia (2011), Contraceptive management (08/26/2013), migraines, No pertinent past medical history, and Vaginal Pap smear, abnormal.   Outpatient Encounter Medications as of 01/22/2020  Medication Sig   escitalopram (LEXAPRO) 20 MG tablet Take 20 mg by mouth daily.   cyclobenzaprine (FLEXERIL) 10 MG tablet Take 1 tablet (10 mg total) by mouth 3 (three) times daily as needed for muscle spasms.   diclofenac (VOLTAREN) 75 MG EC tablet Take 1 tablet (75 mg total) by mouth 2 (two) times daily.   diphenhydrAMINE (BENADRYL) 25 MG tablet Take 25 mg by mouth as needed.   mirabegron ER (MYRBETRIQ) 50 MG TB24 tablet Take 1 tablet (50  mg total) by mouth daily.   traMADol (ULTRAM) 50 MG tablet One tablet po Q 4-6 hours prn pain   [DISCONTINUED] ARIPiprazole (ABILIFY) 5 MG tablet TAKE (1) TABLET BY MOUTH AT BEDTIME.   [DISCONTINUED] scopolamine (TRANSDERM-SCOP, 1.5 MG,) 1 MG/3DAYS Place 1 patch (1.5 mg total) onto the skin every 3 (three) days.   No facility-administered encounter medications on file as of 01/22/2020.     Review of Systems  Constitutional: Negative for chills and fever.  Respiratory: Negative for shortness of breath.   Cardiovascular: Negative for chest pain.  Gastrointestinal: Negative for abdominal pain.  Musculoskeletal: Positive for back pain.       Pain located on the right side buttock area.  Neurological: Negative for weakness and numbness.       No "electrical "radiating sensation.  No numbness, no tingling, no weakness.  Moving slowly due to pain.     Vitals BP (!) 130/94    Pulse 95    Temp 98.1 F (36.7 C)    LMP 08/22/2013    SpO2 97%   Objective:   Physical Exam Vitals and nursing note reviewed.  Constitutional:      General: She is not in acute distress.    Appearance: Normal appearance. She is not ill-appearing.  Eyes:     Extraocular Movements: Extraocular movements intact.     Pupils: Pupils are equal,  round, and reactive to light.  Cardiovascular:     Rate and Rhythm: Normal rate and regular rhythm.     Heart sounds: Normal heart sounds.  Pulmonary:     Effort: Pulmonary effort is normal.     Breath sounds: Normal breath sounds.  Musculoskeletal:        General: Tenderness and signs of injury present.     Comments: Right side buttock pain.  Skin:    General: Skin is warm and dry.  Neurological:     General: No focal deficit present.     Mental Status: She is alert and oriented to person, place, and time.     Cranial Nerves: No cranial nerve deficit.     Sensory: No sensory deficit.     Motor: No weakness.     Coordination: Coordination normal.  Finger-Nose-Finger Test and Heel to Shin Test normal.     Gait: Gait normal.     Comments: Walking slowly due to pain.  Detailed neuro exam negative.  No red flags noted today.  Psychiatric:        Mood and Affect: Mood normal.        Behavior: Behavior normal.        Thought Content: Thought content normal.        Judgment: Judgment normal.      Assessment and Plan   1. Low back sprain, initial encounter - diclofenac (VOLTAREN) 75 MG EC tablet; Take 1 tablet (75 mg total) by mouth 2 (two) times daily.  Dispense: 30 tablet; Refill: 1 - cyclobenzaprine (FLEXERIL) 10 MG tablet; Take 1 tablet (10 mg total) by mouth 3 (three) times daily as needed for muscle spasms.  Dispense: 30 tablet; Refill: 1   Feel this is muscular in nature, due to the quick motion during the woodworking when the sawhorse fell.  Recommend conservative treatment with RICE.  Once she starts to feel better, she will begin low back stretches and exercises.  She will continue to rest, use ice as instructed, use anti-inflammatories with food, and muscle relaxers as tolerated.  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  She understands this could take several weeks to resolve, she will let me know if her symptoms worsen in any way, any numbness, tingling, radiating pain, weakness.  Agrees with plan of care discussed today. Understands warning signs to seek further care: Chest pain, shortness of breath, numbness, tingling, weakness, any significant changes in health status. Understands to follow-up in 2 weeks, sooner if needed.  Work note given for "out of work "on November 18.

## 2020-01-22 NOTE — Patient Instructions (Signed)
Take anti-inflammatory with food. Take muscle relaxant as tolerated. Ice area for 20  Minutes every 1-2 hours Rest     Low Back Sprain or Strain Rehab Ask your health care provider which exercises are safe for you. Do exercises exactly as told by your health care provider and adjust them as directed. It is normal to feel mild stretching, pulling, tightness, or discomfort as you do these exercises. Stop right away if you feel sudden pain or your pain gets worse. Do not begin these exercises until told by your health care provider. Stretching and range-of-motion exercises These exercises warm up your muscles and joints and improve the movement and flexibility of your back. These exercises also help to relieve pain, numbness, and tingling. Lumbar rotation  1. Lie on your back on a firm surface and bend your knees. 2. Straighten your arms out to your sides so each arm forms a 90-degree angle (right angle) with a side of your body. 3. Slowly move (rotate) both of your knees to one side of your body until you feel a stretch in your lower back (lumbar). Try not to let your shoulders lift off the floor. 4. Hold this position for __________ seconds. 5. Tense your abdominal muscles and slowly move your knees back to the starting position. 6. Repeat this exercise on the other side of your body. Repeat __________ times. Complete this exercise __________ times a day. Single knee to chest  1. Lie on your back on a firm surface with both legs straight. 2. Bend one of your knees. Use your hands to move your knee up toward your chest until you feel a gentle stretch in your lower back and buttock. ? Hold your leg in this position by holding on to the front of your knee. ? Keep your other leg as straight as possible. 3. Hold this position for __________ seconds. 4. Slowly return to the starting position. 5. Repeat with your other leg. Repeat __________ times. Complete this exercise __________ times a  day. Prone extension on elbows  1. Lie on your abdomen on a firm surface (prone position). 2. Prop yourself up on your elbows. 3. Use your arms to help lift your chest up until you feel a gentle stretch in your abdomen and your lower back. ? This will place some of your body weight on your elbows. If this is uncomfortable, try stacking pillows under your chest. ? Your hips should stay down, against the surface that you are lying on. Keep your hip and back muscles relaxed. 4. Hold this position for __________ seconds. 5. Slowly relax your upper body and return to the starting position. Repeat __________ times. Complete this exercise __________ times a day. Strengthening exercises These exercises build strength and endurance in your back. Endurance is the ability to use your muscles for a long time, even after they get tired. Pelvic tilt This exercise strengthens the muscles that lie deep in the abdomen. 1. Lie on your back on a firm surface. Bend your knees and keep your feet flat on the floor. 2. Tense your abdominal muscles. Tip your pelvis up toward the ceiling and flatten your lower back into the floor. ? To help with this exercise, you may place a small towel under your lower back and try to push your back into the towel. 3. Hold this position for __________ seconds. 4. Let your muscles relax completely before you repeat this exercise. Repeat __________ times. Complete this exercise __________ times a day. Alternating arm  and leg raises  1. Get on your hands and knees on a firm surface. If you are on a hard floor, you may want to use padding, such as an exercise mat, to cushion your knees. 2. Line up your arms and legs. Your hands should be directly below your shoulders, and your knees should be directly below your hips. 3. Lift your left leg behind you. At the same time, raise your right arm and straighten it in front of you. ? Do not lift your leg higher than your hip. ? Do not lift  your arm higher than your shoulder. ? Keep your abdominal and back muscles tight. ? Keep your hips facing the ground. ? Do not arch your back. ? Keep your balance carefully, and do not hold your breath. 4. Hold this position for __________ seconds. 5. Slowly return to the starting position. 6. Repeat with your right leg and your left arm. Repeat __________ times. Complete this exercise __________ times a day. Abdominal set with straight leg raise  1. Lie on your back on a firm surface. 2. Bend one of your knees and keep your other leg straight. 3. Tense your abdominal muscles and lift your straight leg up, 4-6 inches (10-15 cm) off the ground. 4. Keep your abdominal muscles tight and hold this position for __________ seconds. ? Do not hold your breath. ? Do not arch your back. Keep it flat against the ground. 5. Keep your abdominal muscles tense as you slowly lower your leg back to the starting position. 6. Repeat with your other leg. Repeat __________ times. Complete this exercise __________ times a day. Single leg lower with bent knees 1. Lie on your back on a firm surface. 2. Tense your abdominal muscles and lift your feet off the floor, one foot at a time, so your knees and hips are bent in 90-degree angles (right angles). ? Your knees should be over your hips and your lower legs should be parallel to the floor. 3. Keeping your abdominal muscles tense and your knee bent, slowly lower one of your legs so your toe touches the ground. 4. Lift your leg back up to return to the starting position. ? Do not hold your breath. ? Do not let your back arch. Keep your back flat against the ground. 5. Repeat with your other leg. Repeat __________ times. Complete this exercise __________ times a day. Posture and body mechanics Good posture and healthy body mechanics can help to relieve stress in your body's tissues and joints. Body mechanics refers to the movements and positions of your body  while you do your daily activities. Posture is part of body mechanics. Good posture means:  Your spine is in its natural S-curve position (neutral).  Your shoulders are pulled back slightly.  Your head is not tipped forward. Follow these guidelines to improve your posture and body mechanics in your everyday activities. Standing   When standing, keep your spine neutral and your feet about hip width apart. Keep a slight bend in your knees. Your ears, shoulders, and hips should line up.  When you do a task in which you stand in one place for a long time, place one foot up on a stable object that is 2-4 inches (5-10 cm) high, such as a footstool. This helps keep your spine neutral. Sitting   When sitting, keep your spine neutral and keep your feet flat on the floor. Use a footrest, if necessary, and keep your thighs parallel to the  floor. Avoid rounding your shoulders, and avoid tilting your head forward.  When working at a desk or a computer, keep your desk at a height where your hands are slightly lower than your elbows. Slide your chair under your desk so you are close enough to maintain good posture.  When working at a computer, place your monitor at a height where you are looking straight ahead and you do not have to tilt your head forward or downward to look at the screen. Resting  When lying down and resting, avoid positions that are most painful for you.  If you have pain with activities such as sitting, bending, stooping, or squatting, lie in a position in which your body does not bend very much. For example, avoid curling up on your side with your arms and knees near your chest (fetal position).  If you have pain with activities such as standing for a long time or reaching with your arms, lie with your spine in a neutral position and bend your knees slightly. Try the following positions: ? Lying on your side with a pillow between your knees. ? Lying on your back with a pillow  under your knees. Lifting   When lifting objects, keep your feet at least shoulder width apart and tighten your abdominal muscles.  Bend your knees and hips and keep your spine neutral. It is important to lift using the strength of your legs, not your back. Do not lock your knees straight out.  Always ask for help to lift heavy or awkward objects. This information is not intended to replace advice given to you by your health care provider. Make sure you discuss any questions you have with your health care provider. Document Revised: 06/15/2018 Document Reviewed: 03/15/2018 Elsevier Patient Education  2020 [employer].

## 2020-01-27 ENCOUNTER — Ambulatory Visit: Payer: BC Managed Care – PPO | Admitting: Urology

## 2020-02-05 ENCOUNTER — Encounter: Payer: Self-pay | Admitting: Family Medicine

## 2020-02-05 ENCOUNTER — Ambulatory Visit: Payer: BC Managed Care – PPO | Admitting: Family Medicine

## 2020-02-05 ENCOUNTER — Other Ambulatory Visit: Payer: Self-pay

## 2020-02-05 VITALS — BP 122/74 | HR 96 | Temp 98.0°F | Ht 67.0 in | Wt 269.0 lb

## 2020-02-05 DIAGNOSIS — M545 Low back pain, unspecified: Secondary | ICD-10-CM

## 2020-02-05 DIAGNOSIS — S335XXD Sprain of ligaments of lumbar spine, subsequent encounter: Secondary | ICD-10-CM

## 2020-02-05 NOTE — Progress Notes (Signed)
Patient ID: Katherine Davila, female    DOB: 01-13-1976, 44 y.o.   MRN: [id number]   Chief Complaint  Patient presents with  . Hip Pain   Subjective:  CC: follow-up right hip pain  This is not a new problem.  Here for follow-up for right hip pain.  Initially seen on November 17, diagnosed with low back sprain while woodworking, treated with Voltaren and Flexeril for 4 to 5 days pain is much better, 1/10 pain only with bending/twisting.  Still modifying activity level. follow up on right hip pain. Pt states it is better. Still has pain when turning a certain way.    Medical History Gwynneth has a past medical history of Abnormal Pap smear, Complication of anesthesia (2011), Contraceptive management (08/26/2013), migraines, No pertinent past medical history, and Vaginal Pap smear, abnormal.   Outpatient Encounter Medications as of 02/05/2020  Medication Sig  . cyclobenzaprine (FLEXERIL) 10 MG tablet Take 1 tablet (10 mg total) by mouth 3 (three) times daily as needed for muscle spasms.  . diclofenac (VOLTAREN) 75 MG EC tablet Take 1 tablet (75 mg total) by mouth 2 (two) times daily.  . diphenhydrAMINE (BENADRYL) 25 MG tablet Take 25 mg by mouth as needed.  Marland Kitchen escitalopram (LEXAPRO) 20 MG tablet Take 20 mg by mouth daily.  . mirabegron ER (MYRBETRIQ) 50 MG TB24 tablet Take 1 tablet (50 mg total) by mouth daily.  . traMADol (ULTRAM) 50 MG tablet One tablet po Q 4-6 hours prn pain   No facility-administered encounter medications on file as of 02/05/2020.     Review of Systems  Constitutional: Negative for chills and fever.  Respiratory: Negative for shortness of breath.   Cardiovascular: Negative for chest pain.  Gastrointestinal: Negative for abdominal pain.  Musculoskeletal: Positive for back pain. Negative for gait problem.       Much improved. Only with bending/twisting/activity. 1/10.  Neurological: Negative for weakness and numbness.     Vitals BP 122/74   Pulse 96   Temp 98 F  (36.7 C)   Ht 5\' 7"  (1.702 m)   Wt 269 lb (122 kg)   LMP 08/22/2013   SpO2 98%   BMI 42.13 kg/m   Objective:   Physical Exam Vitals and nursing note reviewed.  Constitutional:      Appearance: Normal appearance.  Cardiovascular:     Rate and Rhythm: Normal rate and regular rhythm.     Heart sounds: Normal heart sounds.  Pulmonary:     Effort: Pulmonary effort is normal.     Breath sounds: Normal breath sounds.  Musculoskeletal:        General: Tenderness present. No swelling or deformity.     Comments: Continues to have mild pain mid pelvis/mid back. 1/10 with bending. Negative straight leg raise. Much improved.   Neurological:     General: No focal deficit present.     Mental Status: She is alert and oriented to person, place, and time.  Psychiatric:        Mood and Affect: Mood normal.        Behavior: Behavior normal.        Thought Content: Thought content normal.        Judgment: Judgment normal.      Assessment and Plan   1. Low back sprain, subsequent encounter   Much improved. Not taking Voltarin and Flexeril now. Took for 4-5 days. Still modifying activity level. Will continue to do so for the next few weeks.  Blood pressure elevated on November 17, likely due to pain.  Much better today.  Discussed blood pressure and plasma donation, she is a regular plasma donator.  Discussed proper technique to measure blood pressure.  Agrees with plan of care discussed today. Understands warning signs to seek further care: Chest pain, shortness of breath, numbness, tingling, weakness. Understands to follow-up if symptoms return.     Dorena Bodo, FNP-C  02/05/2020

## 2020-04-08 ENCOUNTER — Other Ambulatory Visit: Payer: Self-pay

## 2020-04-08 ENCOUNTER — Ambulatory Visit (INDEPENDENT_AMBULATORY_CARE_PROVIDER_SITE_OTHER): Payer: BC Managed Care – PPO | Admitting: Family Medicine

## 2020-04-08 ENCOUNTER — Encounter: Payer: Self-pay | Admitting: Family Medicine

## 2020-04-08 VITALS — BP 134/80 | HR 99 | Temp 98.9°F | Ht 67.0 in | Wt 268.0 lb

## 2020-04-08 DIAGNOSIS — I10 Essential (primary) hypertension: Secondary | ICD-10-CM

## 2020-04-08 DIAGNOSIS — R519 Headache, unspecified: Secondary | ICD-10-CM

## 2020-04-08 MED ORDER — AMLODIPINE BESYLATE 2.5 MG PO TABS: 2.5000 mg | ORAL_TABLET | Freq: Every day | ORAL | 1 refills | Status: DC

## 2020-04-08 NOTE — Progress Notes (Signed)
Patient ID: Katherine Davila, female    DOB: 17-May-1975, 45 y.o.   MRN: [id number]   Chief Complaint  Patient presents with  . Headache   Subjective:  CC: headache, and possible elevated blood pressure  This is a new problem.  Presents today for concerns for headache and elevated blood pressure.  Reports that she was on the treadmill this morning, about 15 minutes into her workout, she started having blurry vision, and did not feel right.  She got off the treadmill, got some water, and this has resolved.  She feels okay right now but just not great.  She has had some elevated blood pressures over the last 3 to 4 months, she tries to donate plasma and gets rejected due to her high blood pressure.  Some recent office visits here have shown some elevated blood pressures, and then some normal blood pressures.  She reports that in the remote history she had some history of headaches, her PCP at that time did a head CT to rule out aneurysm, this was negative.  She reports that her heart rate this morning on the treadmill was around 117 bpm when she experienced symptoms.  This is within a healthy heart rate range for her to be exercising.  Denies fever, chills, chest pain, shortness of breath. Does endorse chest tightness which she feels is related to stress.    pt states she has had headache for 5 days which is not usual for her. Has had scan in the past and tried tramadol.   Donates plasma twice a week and states bp has been elevated. Systolic in the 160's and diastolic 103-100.  Has been eating better and going to the gym and today felt like she was going to pass out at gym when exercising.    Medical History Ashia has a past medical history of Abnormal Pap smear, Complication of anesthesia (2011), Contraceptive management (08/26/2013), migraines, No pertinent past medical history, and Vaginal Pap smear, abnormal.   Outpatient Encounter Medications as of 04/08/2020  Medication Sig  . amLODipine  (NORVASC) 2.5 MG tablet Take 1 tablet (2.5 mg total) by mouth daily.  . cyclobenzaprine (FLEXERIL) 10 MG tablet Take 1 tablet (10 mg total) by mouth 3 (three) times daily as needed for muscle spasms.  . diclofenac (VOLTAREN) 75 MG EC tablet Take 1 tablet (75 mg total) by mouth 2 (two) times daily.  . diphenhydrAMINE (BENADRYL) 25 MG tablet Take 25 mg by mouth as needed.  . mirabegron ER (MYRBETRIQ) 50 MG TB24 tablet Take 1 tablet (50 mg total) by mouth daily.  . traMADol (ULTRAM) 50 MG tablet One tablet po Q 4-6 hours prn pain  . [DISCONTINUED] escitalopram (LEXAPRO) 20 MG tablet Take 20 mg by mouth daily.   No facility-administered encounter medications on file as of 04/08/2020.     Review of Systems  Constitutional: Negative for chills and fever.  HENT: Negative for congestion.   Eyes: Positive for visual disturbance.       Had blurred vision today while on treadmill. Has been declined from giving plasma in past 3 months due to elevated blood pressure.   Respiratory: Positive for chest tightness and shortness of breath.        Thinks its stress related. Family stressor recently, has been worried. Will get mammogram soon. GYN appointment in 2 weeks.   Cardiovascular: Positive for leg swelling. Negative for chest pain.       Leg swelling for 2 days on work  days, likely due to sodium intake. Resolved after being off work 1 full day will resolve.   Gastrointestinal: Negative for abdominal pain, diarrhea, nausea and vomiting.  Neurological: Positive for headaches.       Five days straight. Takes Excedrin migraine: takes edge off. Relates to stress.      Vitals BP 134/80   Pulse 99   Temp 98.9 F (37.2 C)   Ht 5\' 7"  (1.702 m)   Wt 268 lb (121.6 kg)   LMP 08/22/2013   SpO2 99%   BMI 41.97 kg/m   Objective:   Physical Exam Vitals reviewed.  Constitutional:      General: She is not in acute distress.    Appearance: Normal appearance. She is obese.  Cardiovascular:     Rate and  Rhythm: Normal rate and regular rhythm.     Heart sounds: Normal heart sounds.  Pulmonary:     Effort: Pulmonary effort is normal.     Breath sounds: Normal breath sounds.  Skin:    General: Skin is warm and dry.  Neurological:     General: No focal deficit present.     Mental Status: She is alert.  Psychiatric:        Behavior: Behavior normal.      Assessment and Plan   1. Essential hypertension - amLODipine (NORVASC) 2.5 MG tablet; Take 1 tablet (2.5 mg total) by mouth daily.  Dispense: 30 tablet; Refill: 1   Prescription given for new blood pressure machine. She will take pressures a few times per week and keep log. She will bring log at one month follow-up visit. She will start medication today. She is making excellent lifestyle changes with diet and exercise. She will continue these efforts. Information given today on DASH diet to reduce sodium.   Agrees with plan of care discussed today. Understands warning signs to seek further care: chest pain, shortness of breath, any significant change in health.  Understands to follow-up in one month with blood pressure readings, sooner if anything changes.    08/24/2013, NP 04/08/2020

## 2020-04-08 NOTE — Patient Instructions (Addendum)
Take blood pressure a few times per week at different times of the day. Keep blood pressure log and bring with you at follow-up.    Amlodipine; Atorvastatin oral tablets What is this medicine? AMLODIPINE; ATORVASTATIN (am LOE di peen; a TORE va sta tin) is a combination of 2 drugs. Amlodipine is a calcium-channel blocker used to lower high blood pressure. It also relieves chest pain caused by angina. Atorvastatin blocks the body's ability to make cholesterol. It can help lower blood cholesterol for patients who are at risk of getting heart disease or a stroke. It is only for patients whose cholesterol level is not controlled by diet. This medicine may be used for other purposes; ask your health care provider or pharmacist if you have questions. COMMON BRAND NAME(S): Caduet What should I tell my health care provider before I take this medicine https://www.mata.com/.pdf">  DASH Eating Plan DASH stands for Dietary Approaches to Stop Hypertension. The DASH eating plan is a healthy eating plan that has been shown to:  Reduce high blood pressure (hypertension).  Reduce your risk for type 2 diabetes, heart disease, and stroke.  Help with weight loss. What are tips for following this plan? Reading food labels  Check food labels for the amount of salt (sodium) per serving. Choose foods with less than 5 percent of the Daily Value of sodium. Generally, foods with less than 300 milligrams (mg) of sodium per serving fit into this eating plan.  To find whole grains, look for the word "whole" as the first word in the ingredient list. Shopping  Buy products labeled as "low-sodium" or "no salt added."  Buy fresh foods. Avoid canned foods and pre-made or frozen meals. Cooking  Avoid adding salt when cooking. Use salt-free seasonings or herbs instead of table salt or sea salt. Check with your health care provider or pharmacist before using salt substitutes.  Do  not fry foods. Cook foods using healthy methods such as baking, boiling, grilling, roasting, and broiling instead.  Cook with heart-healthy oils, such as olive, canola, avocado, soybean, or sunflower oil. Meal planning  Eat a balanced diet that includes: ? 4 or more servings of fruits and 4 or more servings of vegetables each day. Try to fill one-half of your plate with fruits and vegetables. ? 6-8 servings of whole grains each day. ? Less than 6 oz (170 g) of lean meat, poultry, or fish each day. A 3-oz (85-g) serving of meat is about the same size as a deck of cards. One egg equals 1 oz (28 g). ? 2-3 servings of low-fat dairy each day. One serving is 1 cup (237 mL). ? 1 serving of nuts, seeds, or beans 5 times each week. ? 2-3 servings of heart-healthy fats. Healthy fats called omega-3 fatty acids are found in foods such as walnuts, flaxseeds, fortified milks, and eggs. These fats are also found in cold-water fish, such as sardines, salmon, and mackerel.  Limit how much you eat of: ? Canned or prepackaged foods. ? Food that is high in trans fat, such as some fried foods. ? Food that is high in saturated fat, such as fatty meat. ? Desserts and other sweets, sugary drinks, and other foods with added sugar. ? Full-fat dairy products.  Do not salt foods before eating.  Do not eat more than 4 egg yolks a week.  Try to eat at least 2 vegetarian meals a week.  Eat more home-cooked food and less restaurant, buffet, and fast food.   Lifestyle  When eating at a restaurant, ask that your food be prepared with less salt or no salt, if possible.  If you drink alcohol: ? Limit how much you use to:  0-1 drink a day for women who are not pregnant.  0-2 drinks a day for men. ? Be aware of how much alcohol is in your drink. In the [place] one drink equals one 12 oz bottle of beer (355 mL), one 5 oz glass of wine (148 mL), or one 1 oz glass of hard liquor (44 mL). General information  Avoid  eating more than 2,300 mg of salt a day. If you have hypertension, you may need to reduce your sodium intake to 1,500 mg a day.  Work with your health care provider to maintain a healthy body weight or to lose weight. Ask what an ideal weight is for you.  Get at least 30 minutes of exercise that causes your heart to beat faster (aerobic exercise) most days of the week. Activities may include walking, swimming, or biking.  Work with your health care provider or dietitian to adjust your eating plan to your individual calorie needs. What foods should I eat? Fruits All fresh, dried, or frozen fruit. Canned fruit in natural juice (without added sugar). Vegetables Fresh or frozen vegetables (raw, steamed, roasted, or grilled). Low-sodium or reduced-sodium tomato and vegetable juice. Low-sodium or reduced-sodium tomato sauce and tomato paste. Low-sodium or reduced-sodium canned vegetables. Grains Whole-grain or whole-wheat bread. Whole-grain or whole-wheat pasta. Brown rice. Orpah Cobbatmeal. Quinoa. Bulgur. Whole-grain and low-sodium cereals. Pita bread. Low-fat, low-sodium crackers. Whole-wheat flour tortillas. Meats and other proteins Skinless chicken or Malawiturkey. Ground chicken or [place]iturkey. Pork with fat trimmed off. Fish and seafood. Egg whites. Dried beans, peas, or lentils. Unsalted nuts, nut butters, and seeds. Unsalted canned beans. Lean cuts of beef with fat trimmed off. Low-sodium, lean precooked or cured meat, such as sausages or meat loaves. Dairy Low-fat (1%) or fat-free (skim) milk. Reduced-fat, low-fat, or fat-free cheeses. Nonfat, low-sodium ricotta or cottage cheese. Low-fat or nonfat yogurt. Low-fat, low-sodium cheese. Fats and oils Soft margarine without trans fats. Vegetable oil. Reduced-fat, low-fat, or light mayonnaise and salad dressings (reduced-sodium). Canola, safflower, olive, avocado, soybean, and sunflower oils. Avocado. Seasonings and condiments Herbs. Spices. Seasoning mixes without  salt. Other foods Unsalted popcorn and pretzels. Fat-free sweets. The items listed above may not be a complete list of foods and beverages you can eat. Contact a dietitian for more information. What foods should I avoid? Fruits Canned fruit in a light or heavy syrup. Fried fruit. Fruit in cream or butter sauce. Vegetables Creamed or fried vegetables. Vegetables in a cheese sauce. Regular canned vegetables (not low-sodium or reduced-sodium). Regular canned tomato sauce and paste (not low-sodium or reduced-sodium). Regular tomato and vegetable juice (not low-sodium or reduced-sodium). Rosita FirePickles. Olives. Grains Baked goods made with fat, such as croissants, muffins, or some breads. Dry pasta or rice meal packs. Meats and other proteins Fatty cuts of meat. Ribs. Fried meat. Tomasa BlaseBacon. Bologna, salami, and other precooked or cured meats, such as sausages or meat loaves. Fat from the back of a pig (fatback). Bratwurst. Salted nuts and seeds. Canned beans with added salt. Canned or smoked fish. Whole eggs or egg yolks. Chicken or Malaw[place]urkey with skin. Dairy Whole or 2% milk, cream, and half-and-half. Whole or full-fat cream cheese. Whole-fat or sweetened yogurt. Full-fat cheese. Nondairy creamers. Whipped toppings. Processed cheese and cheese spreads. Fats and oils Butter. Stick margarine. Lard. Shortening. Ghee. Bacon fat. Tropical oils,  such as coconut, palm kernel, or palm oil. Seasonings and condiments Onion salt, garlic salt, seasoned salt, table salt, and sea salt. Worcestershire sauce. Tartar sauce. Barbecue sauce. Teriyaki sauce. Soy sauce, including reduced-sodium. Steak sauce. Canned and packaged gravies. Fish sauce. Oyster sauce. Cocktail sauce. Store-bought horseradish. Ketchup. Mustard. Meat flavorings and tenderizers. Bouillon cubes. Hot sauces. Pre-made or packaged marinades. Pre-made or packaged taco seasonings. Relishes. Regular salad dressings. Other foods Salted popcorn and pretzels. The items  listed above may not be a complete list of foods and beverages you should avoid. Contact a dietitian for more information. Where to find more information  National Heart, Lung, and Blood Institute: PopSteam.is  American Heart Association: www.heart.org  Academy of Nutrition and Dietetics: www.eatright.org  National Kidney Foundation: www.kidney.org Summary  The DASH eating plan is a healthy eating plan that has been shown to reduce high blood pressure (hypertension). It may also reduce your risk for type 2 diabetes, heart disease, and stroke.  When on the DASH eating plan, aim to eat more fresh fruits and vegetables, whole grains, lean proteins, low-fat dairy, and heart-healthy fats.  With the DASH eating plan, you should limit salt (sodium) intake to 2,300 mg a day. If you have hypertension, you may need to reduce your sodium intake to 1,500 mg a day.  Work with your health care provider or dietitian to adjust your eating plan to your individual calorie needs. This information is not intended to replace advice given to you by your health care provider. Make sure you discuss any questions you have with your health care provider. Document Revised: 01/25/2019 Document Reviewed: 01/25/2019 Elsevier Patient Education  2021 [employer]. ? They need to know if you have any of these conditions:  an alcohol problem  heart problems, including heart failure or aortic stenosis  hormone disorder like diabetes or under-active thyroid  infection  kidney or liver disease  low blood pressure  other medical condition  recent surgery  seizures (convulsions)  severe injury  an unusual or allergic reaction to Amlodipine; Atorvastatin, medicines, foods, dyes, or preservatives  pregnant or trying to get pregnant  breast-feeding How should I use this medicine? Take this medicine by mouth with a glass of water. Follow the directions on the prescription label. You can take the  tablets with or without food. Do not change the amount of grapefruit juice you drink from day to day while taking this drug, or avoid grapefruit juice altogether. Take your doses at regular intervals. Do not take your medicine more often then directed. Do not suddenly stop taking this medicine. Ask your doctor or health care professional how you can gradually reduce the dose. Talk to your pediatrician regarding the use of this medicine in children. Special care may be needed. Overdosage: If you think you have taken too much of this medicine contact a poison control center or emergency room at once. NOTE: This medicine is only for you. Do not share this medicine with others. What if I miss a dose? If you miss a dose, take it as soon as you can. If it is almost time for your next dose, take only that dose. Do not take double or extra doses. What may interact with this medicine? Do not take this medicine with any of the following medications:  other cholesterol medicines known as statins like fluvastatin, lovastatin, pravastatin, and simvastatin  red yeast rice  telaprevir  telithromycin  voriconazole This medicine may also interact with the following medications:  antiviral medicines  for HIV or AIDS  boceprevir  certain medicines for cholesterol like clofibrate, fenofibrate, and gemfibrozil  cyclosporine  digoxin  female hormones, like estrogens or progestins and birth control pills  grapefruit juice  medicines for fungal infections like fluconazole, itraconazole, ketoconazole, voriconazole  medicines for high blood pressure  niacin  rifampin  some antibiotics like clarithromycin, erythromycin, and troleandomycin This list may not describe all possible interactions. Give your health care provider a list of all the medicines, herbs, non-prescription drugs, or dietary supplements you use. Also tell them if you smoke, drink alcohol, or use illegal drugs. Some items may interact  with your medicine. What should I watch for while using this medicine? Visit your doctor or health care professional for regular checks on your progress. You will need to have regular tests to make sure your liver is working properly. Tell your doctor or health care professional as soon as you can if you get any unexplained muscle pain, tenderness, or weakness, especially if you also have a fever and tiredness. Your doctor or health care professional may tell you to stop taking this medicine if you develop muscle problems. If your muscle problems do not go away after stopping this medicine, contact your health care professional. This medicine contains a cholesterol-lowering agent but is only part of a total cholesterol-lowering program. Your physician or dietitian can suggest a low-cholesterol and low-fat diet that will reduce your risk of getting heart and blood vessel disease. Avoid alcohol and smoking, and keep a proper exercise schedule. Check your blood pressure and pulse rate regularly. Ask your doctor or health care professional what your blood pressure and pulse rate should be and when you should contact him or her. This medicine may affect blood sugar levels. If you have diabetes, check with your doctor or health care professional before you change your diet or the dose of your diabetic medicine. You may feel dizzy or lightheaded. Do not drive, use machinery, or do anything that needs mental alertness until you know how this medicine affects you. To reduce the risk of dizzy or fainting spells, do not sit or stand up quickly, especially if you are an older patient. Avoid alcoholic drinks. They can make you more dizzy, increase flushing and rapid heartbeats. This medicine may cause a decrease in Co-Enzyme Q-10. You should make sure that you get enough Co-Enzyme Q-10 while you are taking this medicine. Discuss the foods you eat and the vitamins you take with your health care professional. What side  effects may I notice from receiving this medicine? Side effects that you should report to your doctor or health care professional as soon as possible:  allergic reactions like skin rash, itching or hives, swelling of the face, lips, or tongue  chest pain  dark urine  fast, irregular heartbeat  feeling faint or lightheaded, falls  fever  muscle pain, weakness  redness, blistering, peeling or loosening of the skin, including inside the mouth  swelling of ankles, legs  trouble passing urine or change in the amount of urine  unusually weak or tired  yellowing of the eyes or skin Side effects that usually do not require medical attention (report to your doctor or health care professional if they continue or are bothersome):  diarrhea  facial flushing  gas  headache  nausea, vomiting  stomach upset or pain This list may not describe all possible side effects. Call your doctor for medical advice about side effects. You may report side effects to FDA at  1-800-FDA-1088. Where should I keep my medicine? Keep out of the reach of children. Store at room temperature between 15 and 30 degrees C (59 and 86 degrees F). Throw away any unused medicine after the expiration date. NOTE: This sheet is a summary. It may not cover all possible information. If you have questions about this medicine, talk to your doctor, pharmacist, or health care provider.  2021 Elsevier/Gold Standard (2016-10-05 09:58:24)

## 2020-04-09 LAB — NOVEL CORONAVIRUS, NAA: SARS-CoV-2, NAA: NOT DETECTED

## 2020-04-09 LAB — SPECIMEN STATUS REPORT

## 2020-04-09 LAB — SARS-COV-2, NAA 2 DAY TAT

## 2020-04-23 ENCOUNTER — Other Ambulatory Visit: Payer: Self-pay | Admitting: Family Medicine

## 2020-04-23 ENCOUNTER — Telehealth: Payer: Self-pay

## 2020-04-23 DIAGNOSIS — I10 Essential (primary) hypertension: Secondary | ICD-10-CM

## 2020-04-23 MED ORDER — AMLODIPINE BESYLATE 5 MG PO TABS: 5.0000 mg | ORAL_TABLET | Freq: Every day | ORAL | 0 refills | Status: DC

## 2020-04-23 NOTE — Progress Notes (Signed)
Dose change.

## 2020-04-23 NOTE — Telephone Encounter (Signed)
Pt was seen by Clydie Braun 02/02 for high blood pressure and she is wanting to know what she needs to do does she need to double the meds?  Pt call back [phone]

## 2020-04-23 NOTE — Telephone Encounter (Signed)
Pt contacted and verbalized understanding.  

## 2020-04-23 NOTE — Telephone Encounter (Signed)
Sent new script for 5 mg tablets to [facility] . Continue with BP log and keep original follow-up.  kd

## 2020-04-23 NOTE — Telephone Encounter (Signed)
Patient states her blood pressure still running high 140s-150s over 90s- she is feeling ok but not sure if she needs to increase med- still having headaches off and on

## 2020-04-27 ENCOUNTER — Other Ambulatory Visit (HOSPITAL_COMMUNITY): Payer: Self-pay | Admitting: Obstetrics & Gynecology

## 2020-04-27 ENCOUNTER — Encounter: Payer: Self-pay | Admitting: Advanced Practice Midwife

## 2020-04-27 ENCOUNTER — Ambulatory Visit (INDEPENDENT_AMBULATORY_CARE_PROVIDER_SITE_OTHER): Payer: BC Managed Care – PPO | Admitting: Advanced Practice Midwife

## 2020-04-27 ENCOUNTER — Ambulatory Visit
Admission: EM | Admit: 2020-04-27 | Discharge: 2020-04-27 | Disposition: A | Discharge status: Home / Self Care | Payer: BC Managed Care – PPO | Attending: Emergency Medicine | Admitting: Emergency Medicine

## 2020-04-27 ENCOUNTER — Encounter: Payer: Self-pay | Admitting: Emergency Medicine

## 2020-04-27 ENCOUNTER — Other Ambulatory Visit: Payer: Self-pay

## 2020-04-27 VITALS — BP 147/98 | HR 96 | Ht 67.0 in | Wt 266.0 lb

## 2020-04-27 DIAGNOSIS — S61209A Unspecified open wound of unspecified finger without damage to nail, initial encounter: Secondary | ICD-10-CM

## 2020-04-27 DIAGNOSIS — S61213A Laceration without foreign body of left middle finger without damage to nail, initial encounter: Secondary | ICD-10-CM | POA: Diagnosis not present

## 2020-04-27 DIAGNOSIS — Z1231 Encounter for screening mammogram for malignant neoplasm of breast: Secondary | ICD-10-CM

## 2020-04-27 DIAGNOSIS — Z01419 Encounter for gynecological examination (general) (routine) without abnormal findings: Secondary | ICD-10-CM | POA: Diagnosis not present

## 2020-04-27 MED ORDER — CEPHALEXIN 500 MG PO CAPS: 500.0000 mg | ORAL_CAPSULE | Freq: Four times a day (QID) | ORAL | 0 refills | Status: DC

## 2020-04-27 MED ORDER — TETANUS-DIPHTH-ACELL PERTUSSIS 5-2.5-18.5 LF-MCG/0.5 IM SUSY
0.5000 mL | PREFILLED_SYRINGE | Freq: Once | INTRAMUSCULAR | Status: AC
  Administered 2020-04-27: 0.5 mL via INTRAMUSCULAR

## 2020-04-27 MED ORDER — MUPIROCIN 2 % EX OINT: 1.0000 "application " | TOPICAL_OINTMENT | Freq: Two times a day (BID) | CUTANEOUS | 0 refills | Status: DC

## 2020-04-27 NOTE — ED Provider Notes (Signed)
[facility]   [id number] 04/27/20 Arrival Time: 1511  CC: LACERATION  SUBJECTIVE:  Katherine Davila is a 45 y.o. female presented to the urgent care with a complaint of a left middle finger laceration that occurred today.  Developed the symptom after cutting her finger.  Bleeding controlled.  Currently not on blood thinners.  Denies similar symptoms in the past.  Denies fever, chills, nausea, vomiting, redness, swelling, purulent drainage, decrease strength or sensation.   Td UTD: No.  ROS: As per HPI.  All other pertinent ROS negative.     Past Medical History:  Diagnosis Date  . Abnormal Pap smear   . Complication of anesthesia 2011   two attempts at spinal with last preg  . Contraceptive management 08/26/2013  . Hx of migraines   . Hypertension   . Vaginal Pap smear, abnormal    Past Surgical History:  Procedure Laterality Date  . ABDOMINAL HYSTERECTOMY    . APPENDECTOMY    . CESAREAN SECTION    . CESAREAN SECTION  04/06/2012   Procedure: CESAREAN SECTION;  Surgeon: Lazaro Arms, MD;  Location: [facility];  Service: Obstetrics;  Laterality: N/A;  prev c/s  . cystotomy N/A    cystotomy repari  . LAPAROSCOPIC SALPINGOOPHERECTOMY     right  . WISDOM TOOTH EXTRACTION     Allergies  Allergen Reactions  . Erythromycin Nausea Only  . Penicillins Nausea Only  . Iohexol Nausea And Vomiting    Pt has excessive vomiting with contrast   No current facility-administered medications on file prior to encounter.   Current Outpatient Medications on File Prior to Encounter  Medication Sig Dispense Refill  . amLODipine (NORVASC) 5 MG tablet Take 1 tablet (5 mg total) by mouth daily. 30 tablet 0  . cyclobenzaprine (FLEXERIL) 10 MG tablet Take 1 tablet (10 mg total) by mouth 3 (three) times daily as needed for muscle spasms. 30 tablet 1  . diclofenac (VOLTAREN) 75 MG EC tablet Take 1 tablet (75 mg total) by mouth 2 (two) times daily. 30 tablet 1  . diphenhydrAMINE (BENADRYL) 25  MG tablet Take 25 mg by mouth as needed.    . mirabegron ER (MYRBETRIQ) 50 MG TB24 tablet Take 1 tablet (50 mg total) by mouth daily. 30 tablet 10  . traMADol (ULTRAM) 50 MG tablet One tablet po Q 4-6 hours prn pain 24 tablet 0   Social History   Socioeconomic History  . Marital status: Married    Spouse name: Not on file  . Number of children: Not on file  . Years of education: Not on file  . Highest education level: Not on file  Occupational History  . Occupation: nurse  Tobacco Use  . Smoking status: Never Smoker  . Smokeless tobacco: Never Used  Vaping Use  . Vaping Use: Never used  Substance and Sexual Activity  . Alcohol use: No  . Drug use: No  . Sexual activity: Yes    Birth control/protection: Surgical  Other Topics Concern  . Not on file  Social History Narrative  . Not on file   Social Determinants of Health   Financial Resource Strain: Low Risk   . Difficulty of Paying Living Expenses: Not hard at all  Food Insecurity: No Food Insecurity  . Worried About Programme researcher, broadcasting/film/video in the Last Year: Never true  . Ran Out of Food in the Last Year: Never true  Transportation Needs: No Transportation Needs  . Lack of Transportation (Medical):  No  . Lack of Transportation (Non-Medical): No  Physical Activity: Sufficiently Active  . Days of Exercise per Week: 4 days  . Minutes of Exercise per Session: 100 min  Stress: Stress Concern Present  . Feeling of Stress : Rather much  Social Connections: Socially Integrated  . Frequency of Communication with Friends and Family: More than three times a week  . Frequency of Social Gatherings with Friends and Family: Once a week  . Attends Religious Services: More than 4 times per year  . Active Member of Clubs or Organizations: Yes  . Attends Banker Meetings: More than 4 times per year  . Marital Status: Married  Catering manager Violence: Not At Risk  . Fear of Current or Ex-Partner: No  . Emotionally Abused:  No  . Physically Abused: No  . Sexually Abused: No   Family History  Problem Relation Age of Onset  . Cancer Father        head, neck  . Hypertension Father   . Dementia Maternal Grandmother   . Heart disease Maternal Grandmother   . Hypertension Maternal Grandmother   . Dementia Paternal Grandmother      OBJECTIVE:  Vitals:   04/27/20 1525  BP: (!) 142/94  Pulse: 99  Resp: 18  Temp: 98.5 F (36.9 C)  TempSrc: Oral  SpO2: 95%     General appearance: alert; no distress Heart: RRR, no rub, gallop or murmur  Chest: CTA, heart sounds normal Skin: Laceration of left middle finger, skin covering tip of left middle finger is off, wound present with no active drainage Psychological: alert and cooperative; normal mood and affect   Results for orders placed or performed in visit on 04/08/20  Novel Coronavirus, NAA ([facility])   Specimen: Nasopharyngeal(NP) swabs in vial transport medium   Nasopharynge  Result Value Ref Range   SARS-CoV-2, NAA Not Detected Not Detected  SARS-COV-2, NAA 2 DAY TAT   Nasopharynge  Result Value Ref Range   SARS-CoV-2, NAA 2 DAY TAT Performed   Specimen status report  Result Value Ref Range   specimen status report Comment     Labs Reviewed - No data to display  No results found. .  ASSESSMENT & PLAN:  1. Laceration of left middle finger without foreign body without damage to nail, initial encounter   2. Open wound of finger, initial encounter     Meds ordered this encounter  Medications  . Tdap (BOOSTRIX) injection 0.5 mL  . mupirocin ointment (BACTROBAN) 2 %    Sig: Apply 1 application topically 2 (two) times daily.    Dispense:  22 g    Refill:  0  . cephALEXin (KEFLEX) 500 MG capsule    Sig: Take 1 capsule (500 mg total) by mouth 4 (four) times daily.    Dispense:  20 capsule    Refill:  0   Discharge instructions\  Keep wound clean with warm water and mild soap Apply thin layer of Bactroban as prescribed Keflex was  prescribed/take as directed.   Take OTC ibuprofen or tylenol as needed for pain releif Return sooner or go to the ED if you have any new or worsening symptoms such as increased pain, redness, swelling, drainage, discharge, decreased range of motion of extremity, etc..     Reviewed expectations re: course of current medical issues. Questions answered. Outlined signs and symptoms indicating need for more acute intervention. Patient verbalized understanding. After Visit Summary given.   Durward Parcel, FNP 04/27/20  1702  

## 2020-04-27 NOTE — Progress Notes (Signed)
Katherine Davila 45 y.o.  Vitals:   04/27/20 1406  BP: (!) 147/98  Pulse: 96     Filed Weights   04/27/20 1406  Weight: 266 lb (120.7 kg)    Past Medical History: Past Medical History:  Diagnosis Date  . Abnormal Pap smear   . Complication of anesthesia 2011   two attempts at spinal with last preg  . Contraceptive management 08/26/2013  . Hx of migraines   . Hypertension   . Vaginal Pap smear, abnormal     Past Surgical History: Past Surgical History:  Procedure Laterality Date  . ABDOMINAL HYSTERECTOMY    . APPENDECTOMY    . CESAREAN SECTION    . CESAREAN SECTION  04/06/2012   Procedure: CESAREAN SECTION;  Surgeon: Lazaro Arms, MD;  Location: [facility];  Service: Obstetrics;  Laterality: N/A;  prev c/s  . cystotomy N/A    cystotomy repari  . LAPAROSCOPIC SALPINGOOPHERECTOMY     right  . WISDOM TOOTH EXTRACTION      Family History: Family History  Problem Relation Age of Onset  . Cancer Father        head, neck  . Hypertension Father   . Dementia Maternal Grandmother   . Heart disease Maternal Grandmother   . Hypertension Maternal Grandmother   . Dementia Paternal Grandmother     Social History: Social History   Tobacco Use  . Smoking status: Never Smoker  . Smokeless tobacco: Never Used  Vaping Use  . Vaping Use: Never used  Substance Use Topics  . Alcohol use: No  . Drug use: No    Allergies:  Allergies  Allergen Reactions  . Erythromycin Nausea Only  . Penicillins Nausea Only  . Iohexol Nausea And Vomiting    Pt has excessive vomiting with contrast      Current Outpatient Medications:  .  amLODipine (NORVASC) 5 MG tablet, Take 1 tablet (5 mg total) by mouth daily., Disp: 30 tablet, Rfl: 0 .  cyclobenzaprine (FLEXERIL) 10 MG tablet, Take 1 tablet (10 mg total) by mouth 3 (three) times daily as needed for muscle spasms., Disp: 30 tablet, Rfl: 1 .  diclofenac (VOLTAREN) 75 MG EC tablet, Take 1 tablet (75 mg total) by mouth 2 (two) times  daily., Disp: 30 tablet, Rfl: 1 .  diphenhydrAMINE (BENADRYL) 25 MG tablet, Take 25 mg by mouth as needed., Disp: , Rfl:  .  mirabegron ER (MYRBETRIQ) 50 MG TB24 tablet, Take 1 tablet (50 mg total) by mouth daily., Disp: 30 tablet, Rfl: 10 .  traMADol (ULTRAM) 50 MG tablet, One tablet po Q 4-6 hours prn pain, Disp: 24 tablet, Rfl: 0  History of Present Illness: Here for physical.  Started on BP meds this month, doubled last week. Chopped tip of middle left finger this am, cleaned and irrigated by DR. Patel.  Recommended an urgent care visit for tetanus, possible hand surgeon referral. (cut down to pulp, bleeding stopped) Wants a mammogram, hasn't had one before.     Review of Systems   Patient denies any headaches, blurred vision, shortness of breath, chest pain, abdominal pain, problems with bowel movements, urination, or intercourse.   Physical Exam: General:  Well developed, well nourished, no acute distress Skin:  Warm and dry Neck:  Midline trachea, normal thyroid Lungs; Clear to auscultation bilaterally Breast:  No dominant palpable mass, retraction, or nipple discharge Cardiovascular: Regular rate and rhythm Abdomen:  Soft, non tender, no hepatosplenomegaly Pelvic:  External genitalia is normal in  appearance.  The vagina is normal in appearance. .  No adnexal masses or tenderness noted. Right ovary surgically absent. Exam limited by habitus Extremities:  No swelling or varicosities noted Psych:  No mood changes.     Impression: Normal GYN exam     Plan: Mammogram scheduled for 3/2 at 8:30am at [facility] per PCP To go to urgent care for finger

## 2020-04-27 NOTE — ED Triage Notes (Signed)
Cut left middle finger today.  Skin off of tip of finger

## 2020-04-27 NOTE — Discharge Instructions (Signed)
Keep wound clean with warm water and mild soap Apply thin layer of Bactroban as prescribed Keflex was prescribed/take as directed.   Take OTC ibuprofen or tylenol as needed for pain releif Return sooner or go to the ED if you have any new or worsening symptoms such as increased pain, redness, swelling, drainage, discharge, decreased range of motion of extremity, etc.

## 2020-04-27 NOTE — Patient Instructions (Signed)
Mammogram scheduled for 05/06/20 at 0830. at [facility]. No deodorants/powders/perfumes.

## 2020-04-28 ENCOUNTER — Telehealth: Payer: Self-pay

## 2020-04-28 NOTE — Telephone Encounter (Signed)
I called patient and offered her an appt. And she declined.  She said she still has 5-6 pills left and will wait and see if she needs a refill.

## 2020-04-28 NOTE — Telephone Encounter (Signed)
Pt went to urgent care yesterday cut tip of her finger off and Urgent Care would not call no pain med in because she has traMADol (ULTRAM) 50 MG tablet on file and she is out of this and wants to know if some can be sent to pharmacy for her? [facility] - [place], [place] - [address]   Pt call back [phone]

## 2020-04-28 NOTE — Telephone Encounter (Signed)
I am sorry that she cut her finger. I am unable to prescribe a controlled medication for a problem that I am not treating or evaluated. Clydie Braun

## 2020-05-06 ENCOUNTER — Ambulatory Visit: Payer: BC Managed Care – PPO | Admitting: Family Medicine

## 2020-05-06 ENCOUNTER — Other Ambulatory Visit: Payer: Self-pay

## 2020-05-06 ENCOUNTER — Encounter: Payer: Self-pay | Admitting: Family Medicine

## 2020-05-06 ENCOUNTER — Ambulatory Visit (HOSPITAL_COMMUNITY)
Admission: RE | Admit: 2020-05-06 | Discharge: 2020-05-06 | Disposition: A | Discharge status: Home / Self Care | Payer: BC Managed Care – PPO | Source: Ambulatory Visit | Attending: Obstetrics & Gynecology | Admitting: Obstetrics & Gynecology

## 2020-05-06 VITALS — BP 132/86 | HR 97 | Temp 97.5°F | Ht 67.0 in | Wt 265.0 lb

## 2020-05-06 DIAGNOSIS — I10 Essential (primary) hypertension: Secondary | ICD-10-CM

## 2020-05-06 DIAGNOSIS — Z1231 Encounter for screening mammogram for malignant neoplasm of breast: Secondary | ICD-10-CM | POA: Diagnosis not present

## 2020-05-06 NOTE — Patient Instructions (Addendum)
Start taking amlodipine at bedtime. Continue taking blood pressures in morning If readings do not improve to under 130/80 let me know and will increase dose after 7 days Follow-up in one month with readings.   DASH Eating Plan DASH stands for Dietary Approaches to Stop Hypertension. The DASH eating plan is a healthy eating plan that has been shown to:  Reduce high blood pressure (hypertension).  Reduce your risk for type 2 diabetes, heart disease, and stroke.  Help with weight loss. What are tips for following this plan? Reading food labels  Check food labels for the amount of salt (sodium) per serving. Choose foods with less than 5 percent of the Daily Value of sodium. Generally, foods with less than 300 milligrams (mg) of sodium per serving fit into this eating plan.  To find whole grains, look for the word "whole" as the first word in the ingredient list. Shopping  Buy products labeled as "low-sodium" or "no salt added."  Buy fresh foods. Avoid canned foods and pre-made or frozen meals. Cooking  Avoid adding salt when cooking. Use salt-free seasonings or herbs instead of table salt or sea salt. Check with your health care provider or pharmacist before using salt substitutes.  Do not fry foods. Cook foods using healthy methods such as baking, boiling, grilling, roasting, and broiling instead.  Cook with heart-healthy oils, such as olive, canola, avocado, soybean, or sunflower oil. Meal planning  Eat a balanced diet that includes: ? 4 or more servings of fruits and 4 or more servings of vegetables each day. Try to fill one-half of your plate with fruits and vegetables. ? 6-8 servings of whole grains each day. ? Less than 6 oz (170 g) of lean meat, poultry, or fish each day. A 3-oz (85-g) serving of meat is about the same size as a deck of cards. One egg equals 1 oz (28 g). ? 2-3 servings of low-fat dairy each day. One serving is 1 cup (237 mL). ? 1 serving of nuts, seeds, or  beans 5 times each week. ? 2-3 servings of heart-healthy fats. Healthy fats called omega-3 fatty acids are found in foods such as walnuts, flaxseeds, fortified milks, and eggs. These fats are also found in cold-water fish, such as sardines, salmon, and mackerel.  Limit how much you eat of: ? Canned or prepackaged foods. ? Food that is high in trans fat, such as some fried foods. ? Food that is high in saturated fat, such as fatty meat. ? Desserts and other sweets, sugary drinks, and other foods with added sugar. ? Full-fat dairy products.  Do not salt foods before eating.  Do not eat more than 4 egg yolks a week.  Try to eat at least 2 vegetarian meals a week.  Eat more home-cooked food and less restaurant, buffet, and fast food.   Lifestyle  When eating at a restaurant, ask that your food be prepared with less salt or no salt, if possible.  If you drink alcohol: ? Limit how much you use to:  0-1 drink a day for women who are not pregnant.  0-2 drinks a day for men. ? Be aware of how much alcohol is in your drink. In the [place] one drink equals one 12 oz bottle of beer (355 mL), one 5 oz glass of wine (148 mL), or one 1 oz glass of hard liquor (44 mL). General information  Avoid eating more than 2,300 mg of salt a day. If you have hypertension, you may  need to reduce your sodium intake to 1,500 mg a day.  Work with your health care provider to maintain a healthy body weight or to lose weight. Ask what an ideal weight is for you.  Get at least 30 minutes of exercise that causes your heart to beat faster (aerobic exercise) most days of the week. Activities may include walking, swimming, or biking.  Work with your health care provider or dietitian to adjust your eating plan to your individual calorie needs. What foods should I eat? Fruits All fresh, dried, or frozen fruit. Canned fruit in natural juice (without added sugar). Vegetables Fresh or frozen vegetables (raw, steamed,  roasted, or grilled). Low-sodium or reduced-sodium tomato and vegetable juice. Low-sodium or reduced-sodium tomato sauce and tomato paste. Low-sodium or reduced-sodium canned vegetables. Grains Whole-grain or whole-wheat bread. Whole-grain or whole-wheat pasta. Brown rice. Orpah Cobb. Bulgur. Whole-grain and low-sodium cereals. Pita bread. Low-fat, low-sodium crackers. Whole-wheat flour tortillas. Meats and other proteins Skinless chicken or [place]. Ground chicken or [place]. Pork with fat trimmed off. Fish and seafood. Egg whites. Dried beans, peas, or lentils. Unsalted nuts, nut butters, and seeds. Unsalted canned beans. Lean cuts of beef with fat trimmed off. Low-sodium, lean precooked or cured meat, such as sausages or meat loaves. Dairy Low-fat (1%) or fat-free (skim) milk. Reduced-fat, low-fat, or fat-free cheeses. Nonfat, low-sodium ricotta or cottage cheese. Low-fat or nonfat yogurt. Low-fat, low-sodium cheese. Fats and oils Soft margarine without trans fats. Vegetable oil. Reduced-fat, low-fat, or light mayonnaise and salad dressings (reduced-sodium). Canola, safflower, olive, avocado, soybean, and sunflower oils. Avocado. Seasonings and condiments Herbs. Spices. Seasoning mixes without salt. Other foods Unsalted popcorn and pretzels. Fat-free sweets. The items listed above may not be a complete list of foods and beverages you can eat. Contact a dietitian for more information. What foods should I avoid? Fruits Canned fruit in a light or heavy syrup. Fried fruit. Fruit in cream or butter sauce. Vegetables Creamed or fried vegetables. Vegetables in a cheese sauce. Regular canned vegetables (not low-sodium or reduced-sodium). Regular canned tomato sauce and paste (not low-sodium or reduced-sodium). Regular tomato and vegetable juice (not low-sodium or reduced-sodium). Rosita Fire. Olives. Grains Baked goods made with fat, such as croissants, muffins, or some breads. Dry pasta or rice meal  packs. Meats and other proteins Fatty cuts of meat. Ribs. Fried meat. Tomasa Blase. Bologna, salami, and other precooked or cured meats, such as sausages or meat loaves. Fat from the back of a pig (fatback). Bratwurst. Salted nuts and seeds. Canned beans with added salt. Canned or smoked fish. Whole eggs or egg yolks. Chicken or [place] with skin. Dairy Whole or 2% milk, cream, and half-and-half. Whole or full-fat cream cheese. Whole-fat or sweetened yogurt. Full-fat cheese. Nondairy creamers. Whipped toppings. Processed cheese and cheese spreads. Fats and oils Butter. Stick margarine. Lard. Shortening. Ghee. Bacon fat. Tropical oils, such as coconut, palm kernel, or palm oil. Seasonings and condiments Onion salt, garlic salt, seasoned salt, table salt, and sea salt. Worcestershire sauce. Tartar sauce. Barbecue sauce. Teriyaki sauce. Soy sauce, including reduced-sodium. Steak sauce. Canned and packaged gravies. Fish sauce. Oyster sauce. Cocktail sauce. Store-bought horseradish. Ketchup. Mustard. Meat flavorings and tenderizers. Bouillon cubes. Hot sauces. Pre-made or packaged marinades. Pre-made or packaged taco seasonings. Relishes. Regular salad dressings. Other foods Salted popcorn and pretzels. The items listed above may not be a complete list of foods and beverages you should avoid. Contact a dietitian for more information. Where to find more information  National Heart, Lung, and Blood  Institute: PopSteam.is  American Heart Association: www.heart.org  Academy of Nutrition and Dietetics: www.eatright.org  National Kidney Foundation: www.kidney.org Summary  The DASH eating plan is a healthy eating plan that has been shown to reduce high blood pressure (hypertension). It may also reduce your risk for type 2 diabetes, heart disease, and stroke.  When on the DASH eating plan, aim to eat more fresh fruits and vegetables, whole grains, lean proteins, low-fat dairy, and heart-healthy  fats.  With the DASH eating plan, you should limit salt (sodium) intake to 2,300 mg a day. If you have hypertension, you may need to reduce your sodium intake to 1,500 mg a day.  Work with your health care provider or dietitian to adjust your eating plan to your individual calorie needs. This information is not intended to replace advice given to you by your health care provider. Make sure you discuss any questions you have with your health care provider. Document Revised: 01/25/2019 Document Reviewed: 01/25/2019 Elsevier Patient Education  2021 [employer].

## 2020-05-06 NOTE — Progress Notes (Signed)
Patient ID: Katherine Davila, female    DOB: [birth year], 45 y.o.   MRN: [id number]   Chief Complaint  Patient presents with  . Headache    Blood pressure follow up   Subjective:  CC: follow-up on blood pressure  This is not a new problem.  Presents today to follow-up on blood pressure.  Initially seen on February 2, started on amlodipine 2.5 mg at that time.  Monitored blood pressure at home, notified me on July 17 blood pressure still elevated, amlodipine increased to 5 mg at that time.  Reports that she is now feeling much better, headache has resolved.  Blood pressure readings after increase range 133-139/85-96.  She reports that she takes her blood pressure right before taking her medication in the morning.  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  Is currently working on lifestyle modification and weight loss.  Denies fever, chills, chest pain, shortness of breath, leg swelling, dizziness lightheadedness and headaches.    Medical History Katherine Davila has a past medical history of Abnormal Pap smear, Complication of anesthesia (2011), Contraceptive management (08/26/2013), migraines, Hypertension, and Vaginal Pap smear, abnormal.   Outpatient Encounter Medications as of 05/06/2020  Medication Sig  . amLODipine (NORVASC) 5 MG tablet Take 1 tablet (5 mg total) by mouth daily.  . cephALEXin (KEFLEX) 500 MG capsule Take 1 capsule (500 mg total) by mouth 4 (four) times daily.  . cyclobenzaprine (FLEXERIL) 10 MG tablet Take 1 tablet (10 mg total) by mouth 3 (three) times daily as needed for muscle spasms.  . diclofenac (VOLTAREN) 75 MG EC tablet Take 1 tablet (75 mg total) by mouth 2 (two) times daily.  . diphenhydrAMINE (BENADRYL) 25 MG tablet Take 25 mg by mouth as needed.  . mirabegron ER (MYRBETRIQ) 50 MG TB24 tablet Take 1 tablet (50 mg total) by mouth daily.  . mupirocin ointment (BACTROBAN) 2 % Apply 1 application topically 2 (two) times daily.   . traMADol (ULTRAM) 50 MG tablet One tablet po Q 4-6 hours prn pain   No facility-administered encounter medications on file as of 05/06/2020.     Review of Systems  Constitutional: Negative for chills and fever.  Respiratory: Negative for shortness of breath.   Cardiovascular: Negative for chest pain, palpitations and leg swelling.  Neurological: Negative for dizziness, light-headedness and headaches.     Vitals BP 132/86   Pulse 97   Temp (!) 97.5 F (36.4 C) (Oral)   Ht 5\' 7"  (1.702 m)   Wt 265 lb (120.2 kg)   LMP 08/22/2013   SpO2 99%   BMI 41.50 kg/m   Objective:   Physical Exam Vitals reviewed.  Constitutional:      Appearance: Normal appearance.  Cardiovascular:     Rate and Rhythm: Normal rate and regular rhythm.     Heart sounds: Normal heart sounds.  Pulmonary:     Effort: Pulmonary effort is normal.     Breath sounds: Normal breath sounds.  Skin:    General: Skin is warm and dry.  Neurological:     General: No focal deficit present.     Mental Status: She is alert.  Psychiatric:        Behavior: Behavior normal.      Assessment and Plan   1. Essential hypertension   Plan: start taking amlodipine at bedtime, take pressures in morning, If continues to be greater than 130/80 will increase amlodipine dose to 10  mg.  Continue with lifestyle modification, diet, exercise, sodium restriction.  Continue taking blood pressure a few times per week at home and bring blood pressure log at next follow-up visit.  Agrees with plan of care discussed today. Understands warning signs to seek further care: chest pain, shortness of breath, any significant change in health.  Understands to follow-up in 1 month for blood pressure, with blood pressure readings, sooner if anything changes.  She will notify me after 7 days of taking amlodipine at bedtime, if blood pressures continue to be greater than 130/80.   Dorena Bodo, NP 05/06/20

## 2020-05-18 ENCOUNTER — Other Ambulatory Visit: Payer: Self-pay | Admitting: Family Medicine

## 2020-05-18 ENCOUNTER — Encounter: Payer: Self-pay | Admitting: Family Medicine

## 2020-05-18 DIAGNOSIS — I10 Essential (primary) hypertension: Secondary | ICD-10-CM

## 2020-05-18 MED ORDER — AMLODIPINE BESYLATE 10 MG PO TABS: ORAL_TABLET | ORAL | 0 refills | Status: DC

## 2020-05-18 NOTE — Progress Notes (Signed)
Dose changed

## 2020-06-08 ENCOUNTER — Ambulatory Visit: Payer: BC Managed Care – PPO | Admitting: Family Medicine

## 2020-06-10 ENCOUNTER — Ambulatory Visit: Payer: BC Managed Care – PPO | Admitting: Family Medicine

## 2020-06-10 ENCOUNTER — Encounter: Payer: Self-pay | Admitting: Family Medicine

## 2020-06-10 ENCOUNTER — Other Ambulatory Visit: Payer: Self-pay

## 2020-06-10 VITALS — BP 129/89 | HR 84 | Temp 98.3°F | Ht 67.0 in | Wt 265.0 lb

## 2020-06-10 DIAGNOSIS — Z13220 Encounter for screening for lipoid disorders: Secondary | ICD-10-CM | POA: Diagnosis not present

## 2020-06-10 DIAGNOSIS — Z131 Encounter for screening for diabetes mellitus: Secondary | ICD-10-CM | POA: Diagnosis not present

## 2020-06-10 DIAGNOSIS — I10 Essential (primary) hypertension: Secondary | ICD-10-CM

## 2020-06-10 MED ORDER — ENALAPRIL MALEATE 5 MG PO TABS: 5.0000 mg | ORAL_TABLET | Freq: Every day | ORAL | 1 refills | Status: DC

## 2020-06-10 NOTE — Patient Instructions (Signed)
DASH Eating Plan DASH stands for Dietary Approaches to Stop Hypertension. The DASH eating plan is a healthy eating plan that has been shown to:  Reduce high blood pressure (hypertension).  Reduce your risk for type 2 diabetes, heart disease, and stroke.  Help with weight loss. What are tips for following this plan? Reading food labels  Check food labels for the amount of salt (sodium) per serving. Choose foods with less than 5 percent of the Daily Value of sodium. Generally, foods with less than 300 milligrams (mg) of sodium per serving fit into this eating plan.  To find whole grains, look for the word "whole" as the first word in the ingredient list. Shopping  Buy products labeled as "low-sodium" or "no salt added."  Buy fresh foods. Avoid canned foods and pre-made or frozen meals. Cooking  Avoid adding salt when cooking. Use salt-free seasonings or herbs instead of table salt or sea salt. Check with your health care provider or pharmacist before using salt substitutes.  Do not fry foods. Cook foods using healthy methods such as baking, boiling, grilling, roasting, and broiling instead.  Cook with heart-healthy oils, such as olive, canola, avocado, soybean, or sunflower oil. Meal planning  Eat a balanced diet that includes: ? 4 or more servings of fruits and 4 or more servings of vegetables each day. Try to fill one-half of your plate with fruits and vegetables. ? 6-8 servings of whole grains each day. ? Less than 6 oz (170 g) of lean meat, poultry, or fish each day. A 3-oz (85-g) serving of meat is about the same size as a deck of cards. One egg equals 1 oz (28 g). ? 2-3 servings of low-fat dairy each day. One serving is 1 cup (237 mL). ? 1 serving of nuts, seeds, or beans 5 times each week. ? 2-3 servings of heart-healthy fats. Healthy fats called omega-3 fatty acids are found in foods such as walnuts, flaxseeds, fortified milks, and eggs. These fats are also found in  cold-water fish, such as sardines, salmon, and mackerel.  Limit how much you eat of: ? Canned or prepackaged foods. ? Food that is high in trans fat, such as some fried foods. ? Food that is high in saturated fat, such as fatty meat. ? Desserts and other sweets, sugary drinks, and other foods with added sugar. ? Full-fat dairy products.  Do not salt foods before eating.  Do not eat more than 4 egg yolks a week.  Try to eat at least 2 vegetarian meals a week.  Eat more home-cooked food and less restaurant, buffet, and fast food.   Lifestyle  When eating at a restaurant, ask that your food be prepared with less salt or no salt, if possible.  If you drink alcohol: ? Limit how much you use to:  0-1 drink a day for women who are not pregnant.  0-2 drinks a day for men. ? Be aware of how much alcohol is in your drink. In the [place] one drink equals one 12 oz bottle of beer (355 mL), one 5 oz glass of wine (148 mL), or one 1 oz glass of hard liquor (44 mL). General information  Avoid eating more than 2,300 mg of salt a day. If you have hypertension, you may need to reduce your sodium intake to 1,500 mg a day.  Work with your health care provider to maintain a healthy body weight or to lose weight. Ask what an ideal weight is for you.    Get at least 30 minutes of exercise that causes your heart to beat faster (aerobic exercise) most days of the week. Activities may include walking, swimming, or biking.  Work with your health care provider or dietitian to adjust your eating plan to your individual calorie needs. What foods should I eat? Fruits All fresh, dried, or frozen fruit. Canned fruit in natural juice (without added sugar). Vegetables Fresh or frozen vegetables (raw, steamed, roasted, or grilled). Low-sodium or reduced-sodium tomato and vegetable juice. Low-sodium or reduced-sodium tomato sauce and tomato paste. Low-sodium or reduced-sodium canned vegetables. Grains Whole-grain  or whole-wheat bread. Whole-grain or whole-wheat pasta. Brown rice. Oatmeal. Quinoa. Bulgur. Whole-grain and low-sodium cereals. Pita bread. Low-fat, low-sodium crackers. Whole-wheat flour tortillas. Meats and other proteins Skinless chicken or turkey. Ground chicken or turkey. Pork with fat trimmed off. Fish and seafood. Egg whites. Dried beans, peas, or lentils. Unsalted nuts, nut butters, and seeds. Unsalted canned beans. Lean cuts of beef with fat trimmed off. Low-sodium, lean precooked or cured meat, such as sausages or meat loaves. Dairy Low-fat (1%) or fat-free (skim) milk. Reduced-fat, low-fat, or fat-free cheeses. Nonfat, low-sodium ricotta or cottage cheese. Low-fat or nonfat yogurt. Low-fat, low-sodium cheese. Fats and oils Soft margarine without trans fats. Vegetable oil. Reduced-fat, low-fat, or light mayonnaise and salad dressings (reduced-sodium). Canola, safflower, olive, avocado, soybean, and sunflower oils. Avocado. Seasonings and condiments Herbs. Spices. Seasoning mixes without salt. Other foods Unsalted popcorn and pretzels. Fat-free sweets. The items listed above may not be a complete list of foods and beverages you can eat. Contact a dietitian for more information. What foods should I avoid? Fruits Canned fruit in a light or heavy syrup. Fried fruit. Fruit in cream or butter sauce. Vegetables Creamed or fried vegetables. Vegetables in a cheese sauce. Regular canned vegetables (not low-sodium or reduced-sodium). Regular canned tomato sauce and paste (not low-sodium or reduced-sodium). Regular tomato and vegetable juice (not low-sodium or reduced-sodium). Pickles. Olives. Grains Baked goods made with fat, such as croissants, muffins, or some breads. Dry pasta or rice meal packs. Meats and other proteins Fatty cuts of meat. Ribs. Fried meat. Bacon. Bologna, salami, and other precooked or cured meats, such as sausages or meat loaves. Fat from the back of a pig (fatback).  Bratwurst. Salted nuts and seeds. Canned beans with added salt. Canned or smoked fish. Whole eggs or egg yolks. Chicken or turkey with skin. Dairy Whole or 2% milk, cream, and half-and-half. Whole or full-fat cream cheese. Whole-fat or sweetened yogurt. Full-fat cheese. Nondairy creamers. Whipped toppings. Processed cheese and cheese spreads. Fats and oils Butter. Stick margarine. Lard. Shortening. Ghee. Bacon fat. Tropical oils, such as coconut, palm kernel, or palm oil. Seasonings and condiments Onion salt, garlic salt, seasoned salt, table salt, and sea salt. Worcestershire sauce. Tartar sauce. Barbecue sauce. Teriyaki sauce. Soy sauce, including reduced-sodium. Steak sauce. Canned and packaged gravies. Fish sauce. Oyster sauce. Cocktail sauce. Store-bought horseradish. Ketchup. Mustard. Meat flavorings and tenderizers. Bouillon cubes. Hot sauces. Pre-made or packaged marinades. Pre-made or packaged taco seasonings. Relishes. Regular salad dressings. Other foods Salted popcorn and pretzels. The items listed above may not be a complete list of foods and beverages you should avoid. Contact a dietitian for more information. Where to find more information  National Heart, Lung, and Blood Institute: www.nhlbi.nih.gov  American Heart Association: www.heart.org  Academy of Nutrition and Dietetics: www.eatright.org  National Kidney Foundation: www.kidney.org Summary  The DASH eating plan is a healthy eating plan that has been shown to reduce high   blood pressure (hypertension). It may also reduce your risk for type 2 diabetes, heart disease, and stroke.  When on the DASH eating plan, aim to eat more fresh fruits and vegetables, whole grains, lean proteins, low-fat dairy, and heart-healthy fats.  With the DASH eating plan, you should limit salt (sodium) intake to 2,300 mg a day. If you have hypertension, you may need to reduce your sodium intake to 1,500 mg a day.  Work with your health care  provider or dietitian to adjust your eating plan to your individual calorie needs. This information is not intended to replace advice given to you by your health care provider. Make sure you discuss any questions you have with your health care provider. Document Revised: 01/25/2019 Document Reviewed: 01/25/2019 Elsevier Patient Education  2021 Elsevier Inc.   

## 2020-06-10 NOTE — Progress Notes (Signed)
Patient ID: Katherine Davila, female    DOB: 03-25-1975, 45 y.o.   MRN: [id number]   Chief Complaint  Patient presents with  . Hypertension   Subjective:  CC: follow-up on blood pressure  This is not a new problem. Presents today for blood pressure elevation and swelling in ankles and feet since dose increase of amlodipine.  Blood pressure log today, blood pressure is not optimal.  On 3/14 increased dose of amlodipine to 10 mg, and started taking it at night.  Denies fever, chills, chest pain, shortness of breath.  Does endorse lower extremity swelling.   recheck on blood pressure. Swelling in ankles and feet     .    Medical History Katherine Davila has a past medical history of Abnormal Pap smear, Complication of anesthesia (2011), Contraceptive management (08/26/2013), migraines, Hypertension, and Vaginal Pap smear, abnormal.   Outpatient Encounter Medications as of 06/10/2020  Medication Sig  . amLODipine (NORVASC) 10 MG tablet Take one tablet by mouth daily.  . cephALEXin (KEFLEX) 500 MG capsule Take 1 capsule (500 mg total) by mouth 4 (four) times daily.  . cyclobenzaprine (FLEXERIL) 10 MG tablet Take 1 tablet (10 mg total) by mouth 3 (three) times daily as needed for muscle spasms.  . diclofenac (VOLTAREN) 75 MG EC tablet Take 1 tablet (75 mg total) by mouth 2 (two) times daily.  . diphenhydrAMINE (BENADRYL) 25 MG tablet Take 25 mg by mouth as needed.  . enalapril (VASOTEC) 5 MG tablet Take 1 tablet (5 mg total) by mouth daily.  . mirabegron ER (MYRBETRIQ) 50 MG TB24 tablet Take 1 tablet (50 mg total) by mouth daily.  . mupirocin ointment (BACTROBAN) 2 % Apply 1 application topically 2 (two) times daily.  . traMADol (ULTRAM) 50 MG tablet One tablet po Q 4-6 hours prn pain   No facility-administered encounter medications on file as of 06/10/2020.     Review of Systems  Constitutional: Negative for chills and fever.  Eyes: Negative for visual disturbance.  Respiratory: Negative for  shortness of breath.   Cardiovascular: Positive for leg swelling. Negative for chest pain.       Lower extremity swelling, worse with dose increase of CCB.   Neurological: Negative for dizziness, light-headedness and headaches.     Vitals BP 129/89   Pulse 84   Temp 98.3 F (36.8 C)   Ht 5\' 7"  (1.702 m)   Wt 265 lb (120.2 kg)   LMP 08/22/2013   SpO2 99%   BMI 41.50 kg/m   Objective:   Physical Exam Vitals reviewed.  Constitutional:      Appearance: Normal appearance.  Cardiovascular:     Rate and Rhythm: Normal rate and regular rhythm.     Heart sounds: Normal heart sounds.  Pulmonary:     Effort: Pulmonary effort is normal.     Breath sounds: Normal breath sounds.  Musculoskeletal:     Right lower leg: Edema present.     Left lower leg: Edema present.  Skin:    General: Skin is warm and dry.  Neurological:     General: No focal deficit present.     Mental Status: She is alert.  Psychiatric:        Behavior: Behavior normal.      Assessment and Plan   1. Elevated blood pressure reading in office with diagnosis of hypertension - enalapril (VASOTEC) 5 MG tablet; Take 1 tablet (5 mg total) by mouth daily.  Dispense: 30 tablet; Refill: 1 -  Comprehensive Metabolic Panel (CMET)  2. Screening for lipid disorders - Lipid Profile  3. Screening for diabetes mellitus - HgB A1c   Does not like lower extremity swelling that occurred with dose increase of amlodipine.  Blood pressures not optimal on amlodipine.  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  Will discontinue amlodipine, and start enalapril 5 mg.  Has not had labs, will get CMP, lipid profile and A1c.  She will continue taking her blood pressure and keeping a log, bring this information at her 1 month follow-up  Agrees with plan of care discussed today. Understands warning signs to seek further care: chest pain, shortness of breath, any significant change in health.   Understands to follow-up in 1 month for blood pressure management, with blood pressure log.  Will notify once results of labs are available, will review in detail at 1 month follow-up.    Dorena Bodo, NP 06/10/2020

## 2020-06-11 DIAGNOSIS — Z131 Encounter for screening for diabetes mellitus: Secondary | ICD-10-CM | POA: Diagnosis not present

## 2020-06-11 DIAGNOSIS — Z13220 Encounter for screening for lipoid disorders: Secondary | ICD-10-CM | POA: Diagnosis not present

## 2020-06-11 DIAGNOSIS — I10 Essential (primary) hypertension: Secondary | ICD-10-CM | POA: Diagnosis not present

## 2020-06-12 LAB — COMPREHENSIVE METABOLIC PANEL
ALT: 33 IU/L — ABNORMAL HIGH (ref 0–32)
AST: 20 IU/L (ref 0–40)
Albumin/Globulin Ratio: 1.9 (ref 1.2–2.2)
Albumin: 4.5 g/dL (ref 3.8–4.8)
Alkaline Phosphatase: 86 IU/L (ref 44–121)
BUN/Creatinine Ratio: 12 (ref 9–23)
BUN: 9 mg/dL (ref 6–24)
Bilirubin Total: 0.4 mg/dL (ref 0.0–1.2)
CO2: 23 mmol/L (ref 20–29)
Calcium: 9.6 mg/dL (ref 8.7–10.2)
Chloride: 100 mmol/L (ref 96–106)
Creatinine, Ser: 0.75 mg/dL (ref 0.57–1.00)
Globulin, Total: 2.4 g/dL (ref 1.5–4.5)
Glucose: 103 mg/dL — ABNORMAL HIGH (ref 65–99)
Potassium: 4.2 mmol/L (ref 3.5–5.2)
Sodium: 139 mmol/L (ref 134–144)
Total Protein: 6.9 g/dL (ref 6.0–8.5)
eGFR: 102 mL/min/{1.73_m2} (ref >59)

## 2020-06-12 LAB — LIPID PANEL
Chol/HDL Ratio: 4.9 ratio — ABNORMAL HIGH (ref 0.0–4.4)
Cholesterol, Total: 216 mg/dL — ABNORMAL HIGH (ref 100–199)
HDL: 44 mg/dL (ref >39)
LDL Chol Calc (NIH): 134 mg/dL — ABNORMAL HIGH (ref 0–99)
Triglycerides: 211 mg/dL — ABNORMAL HIGH (ref 0–149)
VLDL Cholesterol Cal: 38 mg/dL (ref 5–40)

## 2020-06-12 LAB — HEMOGLOBIN A1C
Est. average glucose Bld gHb Est-mCnc: 120 mg/dL
Hgb A1c MFr Bld: 5.8 % — ABNORMAL HIGH (ref 4.8–5.6)

## 2020-07-08 ENCOUNTER — Encounter: Payer: Self-pay | Admitting: Family Medicine

## 2020-07-08 ENCOUNTER — Other Ambulatory Visit: Payer: Self-pay

## 2020-07-08 ENCOUNTER — Ambulatory Visit: Payer: BC Managed Care – PPO | Admitting: Family Medicine

## 2020-07-08 VITALS — BP 120/83 | HR 73 | Temp 97.8°F | Ht 67.0 in | Wt 258.0 lb

## 2020-07-08 DIAGNOSIS — I10 Essential (primary) hypertension: Secondary | ICD-10-CM

## 2020-07-08 NOTE — Patient Instructions (Signed)

## 2020-07-08 NOTE — Progress Notes (Addendum)
Patient ID: Katherine Davila, female    DOB: [birth year], 45 y.o.   MRN: [id number]   Chief Complaint  Patient presents with  . Hypertension    Follow up , dizziness, vomiting, blurry vision , confusion 7 days ago x 3 days    Subjective:  CC: follow-up blood pressure  Presents today to follow-up for blood pressure, recently started on enalapril due to ankle swelling from calcium channel blocker.  Reports that blood pressure has been well controlled, however, last Wednesday morning she started feeling nausea and vomiting, around 3:45 PM reported that she felt confused with blurry vision, this resolved.  On Thursday reports having headache, between 11 AM and 1 PM experienced blurry vision which lasted about 30 minutes, went away with her eyes closed, around 6:30 PM experienced blurred vision again.  On Friday morning around 1:00 experience blurred vision that lasted around 20 minutes.  Has a history of migraines, reports that this is not exactly how a migraine presents for her.  Symptoms have been not present since last Friday.  Reports she had visual disturbances, vomiting, headaches and confusion.  Reports that she stopped the enalapril on Saturday and Sunday after these episodes to see if they would resolve, has since restarted enalapril without further occurrences.    Medical History Katherine Davila has a past medical history of Abnormal Pap smear, Complication of anesthesia (2011), Contraceptive management (08/26/2013), migraines, Hypertension, and Vaginal Pap smear, abnormal.   Outpatient Encounter Medications as of 07/08/2020  Medication Sig  . cyclobenzaprine (FLEXERIL) 10 MG tablet Take 1 tablet (10 mg total) by mouth 3 (three) times daily as needed for muscle spasms.  . diclofenac (VOLTAREN) 75 MG EC tablet Take 1 tablet (75 mg total) by mouth 2 (two) times daily.  . diphenhydrAMINE (BENADRYL) 25 MG tablet Take 25 mg by mouth as needed.  . enalapril (VASOTEC) 5 MG tablet Take 1 tablet (5 mg total)  by mouth daily.  . mirabegron ER (MYRBETRIQ) 50 MG TB24 tablet Take 1 tablet (50 mg total) by mouth daily.  . mupirocin ointment (BACTROBAN) 2 % Apply 1 application topically 2 (two) times daily.  . traMADol (ULTRAM) 50 MG tablet One tablet po Q 4-6 hours prn pain  . [DISCONTINUED] cephALEXin (KEFLEX) 500 MG capsule Take 1 capsule (500 mg total) by mouth 4 (four) times daily.   No facility-administered encounter medications on file as of 07/08/2020.     Review of Systems  Eyes: Positive for visual disturbance.  Gastrointestinal: Positive for vomiting.  Neurological: Positive for headaches.  Psychiatric/Behavioral: Positive for confusion.     Vitals BP 120/83   Pulse 73   Temp 97.8 F (36.6 C)   Ht 5\' 7"  (1.702 m)   Wt 258 lb (117 kg)   LMP 08/22/2013   SpO2 99%   BMI 40.41 kg/m   Objective:   Physical Exam Vitals and nursing note reviewed.  Constitutional:      Appearance: Normal appearance.  Cardiovascular:     Rate and Rhythm: Normal rate and regular rhythm.     Heart sounds: Normal heart sounds.  Pulmonary:     Effort: Pulmonary effort is normal.     Breath sounds: Normal breath sounds.  Skin:    General: Skin is warm and dry.  Neurological:     General: No focal deficit present.     Mental Status: She is alert.  Psychiatric:        Behavior: Behavior normal.      Assessment  and Plan   1. Elevated blood pressure reading in office with diagnosis of hypertension   See HPI.  Symptoms concerning for TIA. Symptoms completely resolved.  Suggested MRI of brain, declines at this time due to cost.  Promises she will go to the emergency department if symptoms occur again.  Blood pressure well controlled today with enalapril.  She will continue to monitor her blood pressure and let us know if this changes.  Unfortunately, she was unable to take her blood pressure during the episodes described in HPI.   Hypertension Medication compliance: taking daily as prescribed.   Denies chest pain, shortness of breath, lower extremity edema. Had 3 days with blurred vision, confusion, headache, vomiting (unsure if related to migraine or not).  Pertinent lab work: last labs 06/11/20 Monitoring: every 6 months Side effects: unsure (see above).  Continue current medication regimen: continue with current regimen.  Agrees with plan of care discussed today. Understands warning signs to seek further care: chest pain, shortness of breath, any significant change in health.  Understands to follow-up in 6 months for routine medication management for blood pressure, sooner if anything changes, will report to the emergency department at the first sign of any vision changes, confusion, concerning neurological symptoms.    Dorena Bodo, NP 07/08/2020

## 2020-08-20 ENCOUNTER — Other Ambulatory Visit: Payer: Self-pay | Admitting: Family Medicine

## 2020-08-20 DIAGNOSIS — I10 Essential (primary) hypertension: Secondary | ICD-10-CM

## 2020-10-24 ENCOUNTER — Other Ambulatory Visit: Payer: Self-pay | Admitting: Family Medicine

## 2020-10-24 DIAGNOSIS — I10 Essential (primary) hypertension: Secondary | ICD-10-CM

## 2020-10-26 NOTE — Telephone Encounter (Signed)
Pt needs to establish with the new provider for more refills.  Dr. Ladona Ridgel

## 2021-01-06 ENCOUNTER — Telehealth: Payer: Self-pay | Admitting: Family Medicine

## 2021-01-06 DIAGNOSIS — I10 Essential (primary) hypertension: Secondary | ICD-10-CM

## 2021-01-06 MED ORDER — ENALAPRIL MALEATE 5 MG PO TABS: 5.0000 mg | ORAL_TABLET | Freq: Every day | ORAL | 0 refills | Status: DC

## 2021-01-06 NOTE — Telephone Encounter (Signed)
Patient has appointment 11/15 but needs a refill on enalapril 5mg  to hold her over until appointment.  [facility]othe[facility]ary  CB#  [phone]

## 2021-01-06 NOTE — Telephone Encounter (Signed)
30 day sent to pharmacy. Pt contacted; pt needed to reschedule due to prior obligations. Rescheduled for 01/20/21 at 8:40

## 2021-01-19 ENCOUNTER — Ambulatory Visit: Payer: BC Managed Care – PPO | Admitting: Family Medicine

## 2021-01-20 ENCOUNTER — Encounter: Payer: Self-pay | Admitting: Family Medicine

## 2021-01-20 ENCOUNTER — Other Ambulatory Visit: Payer: Self-pay

## 2021-01-20 ENCOUNTER — Ambulatory Visit: Payer: BC Managed Care – PPO | Admitting: Family Medicine

## 2021-01-20 VITALS — BP 138/84 | HR 102 | Temp 98.1°F | Ht 67.0 in | Wt 249.4 lb

## 2021-01-20 DIAGNOSIS — J988 Other specified respiratory disorders: Secondary | ICD-10-CM | POA: Diagnosis not present

## 2021-01-20 DIAGNOSIS — E785 Hyperlipidemia, unspecified: Secondary | ICD-10-CM | POA: Insufficient documentation

## 2021-01-20 DIAGNOSIS — I10 Essential (primary) hypertension: Secondary | ICD-10-CM | POA: Diagnosis not present

## 2021-01-20 DIAGNOSIS — E782 Mixed hyperlipidemia: Secondary | ICD-10-CM | POA: Insufficient documentation

## 2021-01-20 MED ORDER — DOXYCYCLINE HYCLATE 100 MG PO CAPS: 100.0000 mg | ORAL_CAPSULE | Freq: Two times a day (BID) | ORAL | 0 refills | Status: DC

## 2021-01-20 NOTE — Patient Instructions (Signed)
Continue your Enalapril. BP's look good.  Antibiotic twice daily as prescribed. Take with food.  Follow up in 6 months.  Take care  Dr. Adriana Simas

## 2021-01-20 NOTE — Assessment & Plan Note (Signed)
Well controlled. Continue Enalapril.  

## 2021-01-20 NOTE — Progress Notes (Signed)
Subjective:  Patient ID: Katherine Davila, female    DOB: [birth year]  Age: 45 y.o. MRN: [id number]  CC: Chief Complaint  Patient presents with   Establish Care    Pt having cough, sore throat for about one month.     HPI:  45 year old female with hypertension and hyperlipidemia presents for follow-up and to establish care with me.  Issues and concerns are below.  Respiratory infection: Patient states that she been sick for the past 7 days.  She states that she is having persistent cough and associated sore throat.  Denies congestion.  No fever.  No body aches.  She states that this is her second time being sick in the past month.  Patient works in Teacher, music.  She works for [employer].  She is concerned given her job and the fact that she is not improving.  Hypertension: Blood pressures have been well controlled.  She brought in her home readings and the majority are under 120/80.  Patient is currently on enalapril and doing well.  Patient Active Problem List   Diagnosis Date Noted   Mixed hyperlipidemia 01/20/2021   Respiratory infection 01/20/2021   Essential hypertension 04/08/2020   Urinary incontinence 10/03/2019   Bipolar disorder, mixed (HCC) 06/15/2019    Social Hx   Social History   Socioeconomic History   Marital status: Married    Spouse name: Not on file   Number of children: Not on file   Years of education: Not on file   Highest education level: Not on file  Occupational History   Occupation: nurse  Tobacco Use   Smoking status: Never   Smokeless tobacco: Never  Vaping Use   Vaping Use: Never used  Substance and Sexual Activity   Alcohol use: No   Drug use: No   Sexual activity: Yes    Birth control/protection: Surgical  Other Topics Concern   Not on file  Social History Narrative   Not on file   Social Determinants of Health   Financial Resource Strain: Low Risk    Difficulty of Paying Living Expenses: Not hard at all  Food Insecurity: No Food  Insecurity   Worried About Programme researcher, broadcasting/film/video in the Last Year: Never true   Ran Out of Food in the Last Year: Never true  Transportation Needs: No Transportation Needs   Lack of Transportation (Medical): No   Lack of Transportation (Non-Medical): No  Physical Activity: Sufficiently Active   Days of Exercise per Week: 4 days   Minutes of Exercise per Session: 100 min  Stress: Stress Concern Present   Feeling of Stress : Rather much  Social Connections: Socially Integrated   Frequency of Communication with Friends and Family: More than three times a week   Frequency of Social Gatherings with Friends and Family: Once a week   Attends Religious Services: More than 4 times per year   Active Member of [employer] or Organizations: Yes   Attends Engineer, structural: More than 4 times per year   Marital Status: Married    Review of Systems Per HPI  Objective:  BP 138/84   Pulse (!) 102   Temp 98.1 F (36.7 C)   Ht 5\' 7"  (1.702 m)   Wt 249 lb 6.4 oz (113.1 kg)   LMP 08/22/2013   SpO2 99%   BMI 39.06 kg/m   BP/Weight 01/20/2021 07/08/2020 06/10/2020  Systolic BP 138 120 129  Diastolic BP 84 83 89  Wt. (Lbs) 249.4 258  265  BMI 39.06 40.41 41.5    Physical Exam Vitals and nursing note reviewed.  Constitutional:      General: She is not in acute distress.    Appearance: Normal appearance. She is not ill-appearing.  HENT:     Head: Normocephalic and atraumatic.     Right Ear: Tympanic membrane normal.     Left Ear: Tympanic membrane normal.     Mouth/Throat:     Pharynx: Posterior oropharyngeal erythema present.  Eyes:     General:        Right eye: No discharge.        Left eye: No discharge.     Conjunctiva/sclera: Conjunctivae normal.  Cardiovascular:     Rate and Rhythm: Normal rate and regular rhythm.  Pulmonary:     Effort: Pulmonary effort is normal.     Breath sounds: Normal breath sounds. No wheezing, rhonchi or rales.  Neurological:     Mental Status: She  is alert.  Psychiatric:        Mood and Affect: Mood normal.        Behavior: Behavior normal.    Lab Results  Component Value Date   WBC 9.8 04/11/2014   HGB 13.6 04/11/2014   HCT 40.3 04/11/2014   PLT 244 04/11/2014   GLUCOSE 103 (H) 06/11/2020   CHOL 216 (H) 06/11/2020   TRIG 211 (H) 06/11/2020   HDL 44 06/11/2020   LDLCALC 134 (H) 06/11/2020   ALT 33 (H) 06/11/2020   AST 20 06/11/2020   NA 139 06/11/2020   K 4.2 06/11/2020   CL 100 06/11/2020   CREATININE 0.75 06/11/2020   BUN 9 06/11/2020   CO2 23 06/11/2020   TSH 0.928 11/28/2013   HGBA1C 5.8 (H) 06/11/2020     Assessment & Plan:   Problem List Items Addressed This Visit       Cardiovascular and Mediastinum   Essential hypertension    Well controlled. Continue Enalapril.        Respiratory   Respiratory infection - Primary    Given duration of illness and lack of improvement, starting on Antibiotic therapy with Doxycycline.       Meds ordered this encounter  Medications   doxycycline (VIBRAMYCIN) 100 MG capsule    Sig: Take 1 capsule (100 mg total) by mouth 2 (two) times daily.    Dispense:  14 capsule    Refill:  0    Follow-up:  Return in about 6 months (around 07/20/2021) for HTN follow up.  Everlene Other DO [facility]

## 2021-01-20 NOTE — Assessment & Plan Note (Signed)
Given duration of illness and lack of improvement, starting on Antibiotic therapy with Doxycycline.

## 2021-02-08 ENCOUNTER — Other Ambulatory Visit: Payer: Self-pay | Admitting: Family Medicine

## 2021-02-08 DIAGNOSIS — I10 Essential (primary) hypertension: Secondary | ICD-10-CM

## 2021-02-09 ENCOUNTER — Other Ambulatory Visit: Payer: Self-pay | Admitting: Family Medicine

## 2021-02-09 DIAGNOSIS — I10 Essential (primary) hypertension: Secondary | ICD-10-CM

## 2021-02-10 MED ORDER — ENALAPRIL MALEATE 5 MG PO TABS: 5.0000 mg | ORAL_TABLET | Freq: Every day | ORAL | 0 refills | Status: DC

## 2021-04-26 ENCOUNTER — Ambulatory Visit (INDEPENDENT_AMBULATORY_CARE_PROVIDER_SITE_OTHER): Payer: BC Managed Care – PPO | Admitting: Nurse Practitioner

## 2021-04-26 ENCOUNTER — Telehealth: Payer: Self-pay | Admitting: *Deleted

## 2021-04-26 ENCOUNTER — Other Ambulatory Visit: Payer: Self-pay

## 2021-04-26 ENCOUNTER — Encounter: Payer: Self-pay | Admitting: Nurse Practitioner

## 2021-04-26 DIAGNOSIS — U071 COVID-19: Secondary | ICD-10-CM | POA: Diagnosis not present

## 2021-04-26 MED ORDER — ONDANSETRON HCL 4 MG PO TABS: 4.0000 mg | ORAL_TABLET | Freq: Three times a day (TID) | ORAL | 0 refills | Status: DC | PRN

## 2021-04-26 MED ORDER — NIRMATRELVIR/RITONAVIR (PAXLOVID)TABLET: 3.0000 | ORAL_TABLET | Freq: Two times a day (BID) | ORAL | 0 refills | Status: AC

## 2021-04-26 NOTE — Progress Notes (Signed)
° °  Subjective:    Patient ID: Katherine Davila, female    DOB: 1975-09-22, 46 y.o.   MRN: [id number]  HPI  Virtual Visit via Telephone Note  I connected with Katherine Davila on 04/26/21 at 11:00 AM EST by telephone and verified that I am speaking with the correct person using two identifiers.  Location: Patient: home Provider: office   I discussed the limitations, risks, security and privacy concerns of performing an evaluation and management service by telephone and the availability of in person appointments. I also discussed with the patient that there may be a patient responsible charge related to this service. The patient expressed understanding and agreed to proceed.   History of Present Illness: Patient tested positive for Covid on Friday.  Her symptoms started Friday around.  Patient complains fever that resolved on Saturday, sore throat, headache, sinus pressure, body aches, chills, nausea.  Patient states that she has not been to eat.  Patient denies any difficulty breathing, shortness of breath.  Taking for the incision and saline nasal spray with some relief.   Observations/Objective:  Spoke with patient via telephone encounter.  Patient using complete sentences and answering questions appropriately.  No signs or symptoms of distress or shortness of breath noted via telephone encounter.   Assessment and Plan: 1. COVID - Paxolovid prescribed. GFR 102. Medications reviewed.  - Zofran 4mg  prescribed.  - May use Mucinex over-the-counter. -May use humidifier or take hot showers for relief. -Remain hydrated by drinking Gatorade, Pedialyte, or body armor. -Try eating soups, broths, mashed potatos, other bland foods to remain nourish. -Return to clinic if symptoms continue. -Go to emergency room if you develop shortness of breath or difficulty breathing. -COVID is detected Under current CDC guidelines it is recommended to stay self isolated for at least 5 days.  If feeling well  after 5 days may return to normal activities as long as you wears a mask for 5 days.  If you are not feeling well after 5 days you should stay under self-isolation for 10 days.  Warning signs to watch for if you develops chest tightness shortness of breath severe pain change in mental status you should seek further evaluation in the ER.  If further questions or concerns please let know    Follow Up Instructions:    I discussed the assessment and treatment plan with the patient. The patient was provided an opportunity to ask questions and all were answered. The patient agreed with the plan and demonstrated an understanding of the instructions.   The patient was advised to call back or seek an in-person evaluation if the symptoms worsen or if the condition fails to improve as anticipated.  I provided  minutes of non-face-to-face time during this encounter.    Note:  This document was prepared using Dragon voice recognition software and may include unintentional dictation errors.

## 2021-04-26 NOTE — Telephone Encounter (Signed)
Ms. gisell, buehrle are scheduled for a virtual visit with your provider today.    Just as we do with appointments in the office, we must obtain your consent to participate.  Your consent will be active for this visit and any virtual visit you may have with one of our providers in the next 365 days.    If you have a MyChart account, I can also send a copy of this consent to you electronically.  All virtual visits are billed to your insurance company just like a traditional visit in the office.  As this is a virtual visit, video technology does not allow for your provider to perform a traditional examination.  This may limit your provider's ability to fully assess your condition.  If your provider identifies any concerns that need to be evaluated in person or the need to arrange testing such as labs, EKG, etc, we will make arrangements to do so.    Although advances in technology are sophisticated, we cannot ensure that it will always work on either your end or our end.  If the connection with a video visit is poor, we may have to switch to a telephone visit.  With either a video or telephone visit, we are not always able to ensure that we have a secure connection.   I need to obtain your verbal consent now.   Are you willing to proceed with your visit today?   Patton Salles has provided verbal consent on 04/26/2021 for a virtual visit (video or telephone).   Rocco Serene, LPN 09/08/8887  1:69 AM

## 2021-05-31 ENCOUNTER — Other Ambulatory Visit: Payer: Self-pay | Admitting: Family Medicine

## 2021-05-31 DIAGNOSIS — I10 Essential (primary) hypertension: Secondary | ICD-10-CM

## 2021-07-16 ENCOUNTER — Other Ambulatory Visit: Payer: Self-pay | Admitting: Family Medicine

## 2021-07-16 DIAGNOSIS — I10 Essential (primary) hypertension: Secondary | ICD-10-CM

## 2021-07-21 ENCOUNTER — Ambulatory Visit: Payer: BC Managed Care – PPO | Admitting: Family Medicine

## 2021-07-22 ENCOUNTER — Ambulatory Visit: Payer: BC Managed Care – PPO | Admitting: Family Medicine

## 2021-07-22 ENCOUNTER — Encounter: Payer: Self-pay | Admitting: Family Medicine

## 2021-07-22 VITALS — BP 130/76 | HR 92 | Temp 98.1°F | Wt 257.0 lb

## 2021-07-22 DIAGNOSIS — R7303 Prediabetes: Secondary | ICD-10-CM | POA: Diagnosis not present

## 2021-07-22 DIAGNOSIS — I10 Essential (primary) hypertension: Secondary | ICD-10-CM | POA: Diagnosis not present

## 2021-07-22 DIAGNOSIS — E782 Mixed hyperlipidemia: Secondary | ICD-10-CM | POA: Diagnosis not present

## 2021-07-22 DIAGNOSIS — Z130 Encounter for screening for diseases of the blood and blood-forming organs and certain disorders involving the immune mechanism: Secondary | ICD-10-CM

## 2021-07-22 MED ORDER — ENALAPRIL MALEATE 5 MG PO TABS: 5.0000 mg | ORAL_TABLET | Freq: Every day | ORAL | 3 refills | Status: DC

## 2021-07-22 MED ORDER — ONDANSETRON HCL 4 MG PO TABS: 4.0000 mg | ORAL_TABLET | Freq: Three times a day (TID) | ORAL | 3 refills | Status: DC | PRN

## 2021-07-22 NOTE — Assessment & Plan Note (Signed)
Has been stable. Labs today.  

## 2021-07-22 NOTE — Assessment & Plan Note (Signed)
Stable.  Continue enalapril.  Refilled today. 

## 2021-07-22 NOTE — Assessment & Plan Note (Signed)
Last LDL was 134.  Lipid panel today.

## 2021-07-22 NOTE — Patient Instructions (Signed)
Labs ordered.  Healthy diet and regular exercise.  Follow up in 6 months to 1 year.

## 2021-07-22 NOTE — Progress Notes (Signed)
Subjective:  Patient ID: Katherine Davila, female    DOB: Jun 07, 1975  Age: 46 y.o. MRN: [id number]  CC: Chief Complaint  Patient presents with   Hypertension    Blood pressure has been good; running in the 130s. Pt has experienced some headaches-in the past 2 months pt has had 2 really bad headaches.     HPI:  46 year old female presents for follow-up.  Hypertension is well controlled on enalapril.  Doing well at this time.  Has prediabetes as well as hyperlipidemia.  Needs labs today.  Has been managed with out pharmacotherapy thus far.  Patient Active Problem List   Diagnosis Date Noted   Prediabetes 07/22/2021   Mixed hyperlipidemia 01/20/2021   Essential hypertension 04/08/2020   Urinary incontinence 10/03/2019   Bipolar disorder, mixed ([place]) 06/15/2019    Social Hx   Social History   Socioeconomic History   Marital status: Married    Spouse name: Not on file   Number of children: Not on file   Years of education: Not on file   Highest education level: Not on file  Occupational History   Occupation: nurse  Tobacco Use   Smoking status: Never   Smokeless tobacco: Never  Vaping Use   Vaping Use: Never used  Substance and Sexual Activity   Alcohol use: No   Drug use: No   Sexual activity: Yes    Birth control/protection: Surgical  Other Topics Concern   Not on file  Social History Narrative   Not on file   Social Determinants of Health   Financial Resource Strain: Not on file  Food Insecurity: Not on file  Transportation Needs: Not on file  Physical Activity: Not on file  Stress: Not on file  Social Connections: Not on file    Review of Systems  Constitutional: Negative.   Respiratory: Negative.    Cardiovascular: Negative.   Neurological:  Positive for headaches.    Objective:  BP 130/76   Pulse 92   Temp 98.1 F (36.7 C)   Wt 257 lb (116.6 kg)   LMP 08/22/2013   SpO2 98%   BMI 40.25 kg/m      07/22/2021    9:25 AM 01/20/2021     8:53 AM 07/08/2020    9:04 AM  BP/Weight  Systolic BP 622 633 354  Diastolic BP 76 84 83  Wt. (Lbs) 257 249.4 258  BMI 40.25 kg/m2 39.06 kg/m2 40.41 kg/m2    Physical Exam Vitals and nursing note reviewed.  Constitutional:      General: She is not in acute distress.    Appearance: Normal appearance. She is obese. She is not ill-appearing.  HENT:     Head: Normocephalic and atraumatic.  Eyes:     General:        Right eye: No discharge.        Left eye: No discharge.     Conjunctiva/sclera: Conjunctivae normal.  Cardiovascular:     Rate and Rhythm: Normal rate and regular rhythm.  Pulmonary:     Effort: Pulmonary effort is normal.     Breath sounds: Normal breath sounds. No wheezing, rhonchi or rales.  Neurological:     Mental Status: She is alert.  Psychiatric:        Mood and Affect: Mood normal.        Behavior: Behavior normal.    Lab Results  Component Value Date   WBC 9.8 04/11/2014   HGB 13.6 04/11/2014   HCT 40.3  04/11/2014   PLT 244 04/11/2014   GLUCOSE 103 (H) 06/11/2020   CHOL 216 (H) 06/11/2020   TRIG 211 (H) 06/11/2020   HDL 44 06/11/2020   LDLCALC 134 (H) 06/11/2020   ALT 33 (H) 06/11/2020   AST 20 06/11/2020   NA 139 06/11/2020   K 4.2 06/11/2020   CL 100 06/11/2020   CREATININE 0.75 06/11/2020   BUN 9 06/11/2020   CO2 23 06/11/2020   TSH 0.928 11/28/2013   HGBA1C 5.8 (H) 06/11/2020     Assessment & Plan:   Problem List Items Addressed This Visit       Cardiovascular and Mediastinum   Essential hypertension - Primary    Stable.  Continue enalapril.  Refilled today.       Relevant Medications   enalapril (VASOTEC) 5 MG tablet   Other Relevant Orders   CMP14+EGFR     Other   Mixed hyperlipidemia    Last LDL was 134.  Lipid panel today.       Relevant Medications   enalapril (VASOTEC) 5 MG tablet   Other Relevant Orders   Lipid panel   Prediabetes    Has been stable.  Labs today.       Relevant Orders   Hemoglobin A1c    Other Visit Diagnoses     Screening for deficiency anemia       Relevant Orders   CBC       Meds ordered this encounter  Medications   ondansetron (ZOFRAN) 4 MG tablet    Sig: Take 1 tablet (4 mg total) by mouth every 8 (eight) hours as needed for nausea or vomiting.    Dispense:  20 tablet    Refill:  3   enalapril (VASOTEC) 5 MG tablet    Sig: Take 1 tablet (5 mg total) by mouth daily.    Dispense:  90 tablet    Refill:  [place]

## 2021-07-23 LAB — CMP14+EGFR
ALT: 19 IU/L (ref 0–32)
AST: 18 IU/L (ref 0–40)
Albumin/Globulin Ratio: 1.7 (ref 1.2–2.2)
Albumin: 4.1 g/dL (ref 3.8–4.8)
Alkaline Phosphatase: 80 IU/L (ref 44–121)
BUN/Creatinine Ratio: 11 (ref 9–23)
BUN: 8 mg/dL (ref 6–24)
Bilirubin Total: 0.4 mg/dL (ref 0.0–1.2)
CO2: 25 mmol/L (ref 20–29)
Calcium: 9.6 mg/dL (ref 8.7–10.2)
Chloride: 99 mmol/L (ref 96–106)
Creatinine, Ser: 0.72 mg/dL (ref 0.57–1.00)
Globulin, Total: 2.4 g/dL (ref 1.5–4.5)
Glucose: 104 mg/dL — ABNORMAL HIGH (ref 70–99)
Potassium: 4 mmol/L (ref 3.5–5.2)
Sodium: 138 mmol/L (ref 134–144)
Total Protein: 6.5 g/dL (ref 6.0–8.5)
eGFR: 106 mL/min/{1.73_m2} (ref >59)

## 2021-07-23 LAB — CBC
Hematocrit: 42.6 % (ref 34.0–46.6)
Hemoglobin: 14.5 g/dL (ref 11.1–15.9)
MCH: 31.3 pg (ref 26.6–33.0)
MCHC: 34 g/dL (ref 31.5–35.7)
MCV: 92 fL (ref 79–97)
Platelets: 277 10*3/uL (ref 150–450)
RBC: 4.64 x10E6/uL (ref 3.77–5.28)
RDW: 12.7 % (ref 11.7–15.4)
WBC: 9.2 10*3/uL (ref 3.4–10.8)

## 2021-07-23 LAB — HEMOGLOBIN A1C
Est. average glucose Bld gHb Est-mCnc: 120 mg/dL
Hgb A1c MFr Bld: 5.8 % — ABNORMAL HIGH (ref 4.8–5.6)

## 2021-07-23 LAB — LIPID PANEL
Chol/HDL Ratio: 4.6 ratio — ABNORMAL HIGH (ref 0.0–4.4)
Cholesterol, Total: 213 mg/dL — ABNORMAL HIGH (ref 100–199)
HDL: 46 mg/dL (ref >39)
LDL Chol Calc (NIH): 134 mg/dL — ABNORMAL HIGH (ref 0–99)
Triglycerides: 187 mg/dL — ABNORMAL HIGH (ref 0–149)
VLDL Cholesterol Cal: 33 mg/dL (ref 5–40)

## 2021-07-26 NOTE — Telephone Encounter (Signed)
Message sent to provider in result note

## 2021-07-29 ENCOUNTER — Ambulatory Visit: Payer: BC Managed Care – PPO | Admitting: Family Medicine

## 2021-07-29 DIAGNOSIS — K047 Periapical abscess without sinus: Secondary | ICD-10-CM | POA: Diagnosis not present

## 2021-07-29 MED ORDER — AMOXICILLIN-POT CLAVULANATE 875-125 MG PO TABS: 1.0000 | ORAL_TABLET | Freq: Two times a day (BID) | ORAL | 0 refills | Status: DC

## 2021-07-29 NOTE — Patient Instructions (Signed)
Tylenol and ibuprofen as needed.  Antibiotic twice a day for 10 days.  Call with concerns.  Take care  Dr. Lacinda Axon

## 2021-07-29 NOTE — Assessment & Plan Note (Signed)
Treating with Augmentin (patient states that she has taken this before).

## 2021-07-29 NOTE — Progress Notes (Signed)
Subjective:  Patient ID: Katherine Davila, female    DOB: Sep 25, 1975  Age: 46 y.o. MRN: [id number]  CC: Chief Complaint  Patient presents with   Jaw Pain    On right side. Jaw is swollen. Waiting to have tooth extracted. Has tried warm compresses and tylenol helps with the pain.    HPI:  46 year old female presents for evaluation of the above.  Patient states that she is due to have a tooth removed.  On Tuesday she noticed the right side of her jaw was swollen.  She has some mild to moderate discomfort.  Improves with Tylenol and ibuprofen.  She has also used warm compresses.  Concern for dental infection.  No fever.  No other associated symptoms.  No other complaints.  Patient Active Problem List   Diagnosis Date Noted   Dental infection 07/29/2021   Prediabetes 07/22/2021   Mixed hyperlipidemia 01/20/2021   Essential hypertension 04/08/2020   Urinary incontinence 10/03/2019   Bipolar disorder, mixed ([facility]) 06/15/2019    Social Hx   Social History   Socioeconomic History   Marital status: Married    Spouse name: Not on file   Number of children: Not on file   Years of education: Not on file   Highest education level: Not on file  Occupational History   Occupation: nurse  Tobacco Use   Smoking status: Never   Smokeless tobacco: Never  Vaping Use   Vaping Use: Never used  Substance and Sexual Activity   Alcohol use: No   Drug use: No   Sexual activity: Yes    Birth control/protection: Surgical  Other Topics Concern   Not on file  Social History Narrative   Not on file   Social Determinants of Health   Financial Resource Strain: Not on file  Food Insecurity: Not on file  Transportation Needs: Not on file  Physical Activity: Not on file  Stress: Not on file  Social Connections: Not on file    Review of Systems Per HPI  Objective:  BP 126/87   Pulse 70   Temp 98.2 F (36.8 C) (Oral)   Ht 5\' 7"  (1.702 m)   Wt 255 lb 12.8 oz (116 kg)   LMP 08/22/2013    SpO2 98%   BMI 40.06 kg/m      07/29/2021   10:41 AM 07/22/2021    9:25 AM 01/20/2021    8:53 AM  BP/Weight  Systolic BP 126 130 138  Diastolic BP 87 76 84  Wt. (Lbs) 255.8 257 249.4  BMI 40.06 kg/m2 40.25 kg/m2 39.06 kg/m2    Physical Exam Vitals and nursing note reviewed.  Constitutional:      General: She is not in acute distress.    Appearance: Normal appearance. She is obese.  HENT:     Head: Normocephalic and atraumatic.      Comments: Swelling and mild tenderness at the labelled location.    Mouth/Throat:     Pharynx: Oropharynx is clear.  Eyes:     General:        Right eye: No discharge.        Left eye: No discharge.     Conjunctiva/sclera: Conjunctivae normal.  Pulmonary:     Effort: Pulmonary effort is normal. No respiratory distress.  Neurological:     Mental Status: She is alert.  Psychiatric:        Mood and Affect: Mood normal.        Behavior: Behavior normal.  Lab Results  Component Value Date   WBC 9.2 07/22/2021   HGB 14.5 07/22/2021   HCT 42.6 07/22/2021   PLT 277 07/22/2021   GLUCOSE 104 (H) 07/22/2021   CHOL 213 (H) 07/22/2021   TRIG 187 (H) 07/22/2021   HDL 46 07/22/2021   LDLCALC 134 (H) 07/22/2021   ALT 19 07/22/2021   AST 18 07/22/2021   NA 138 07/22/2021   K 4.0 07/22/2021   CL 99 07/22/2021   CREATININE 0.72 07/22/2021   BUN 8 07/22/2021   CO2 25 07/22/2021   TSH 0.928 11/28/2013   HGBA1C 5.8 (H) 07/22/2021     Assessment & Plan:   Problem List Items Addressed This Visit       Digestive   Dental infection    Treating with Augmentin (patient states that she has taken this before).        Meds ordered this encounter  Medications   amoxicillin-clavulanate (AUGMENTIN) 875-125 MG tablet    Sig: Take 1 tablet by mouth 2 (two) times daily.    Dispense:  20 tablet    Refill:  0   Louine Tenpenny DO [facility]

## 2021-10-26 ENCOUNTER — Telehealth: Payer: Self-pay

## 2021-10-26 NOTE — Telephone Encounter (Signed)
Form in provider office. Please advise. Thank you. 

## 2021-10-26 NOTE — Telephone Encounter (Signed)
Caller name:Katherine Davila  On DPR? :No   Call back number:[phone]  Provider they see: Adriana Simas   Reason for call:Patient dropped off form to be completed so she can donate plasma

## 2021-12-07 IMAGING — MG MM DIGITAL SCREENING BILAT W/ TOMO AND CAD
6 of 10 series · 6 of 30 positions shown · non-contrast
Comparison: None.

CLINICAL DATA: Screening.

EXAM:
DIGITAL SCREENING BILATERAL MAMMOGRAM WITH TOMOSYNTHESIS AND CAD
TECHNIQUE: Bilateral screening digital craniocaudal and mediolateral oblique
mammograms were obtained. Bilateral screening digital breast
tomosynthesis was performed. The images were evaluated with
computer-aided detection.

[R CC synth-2D]
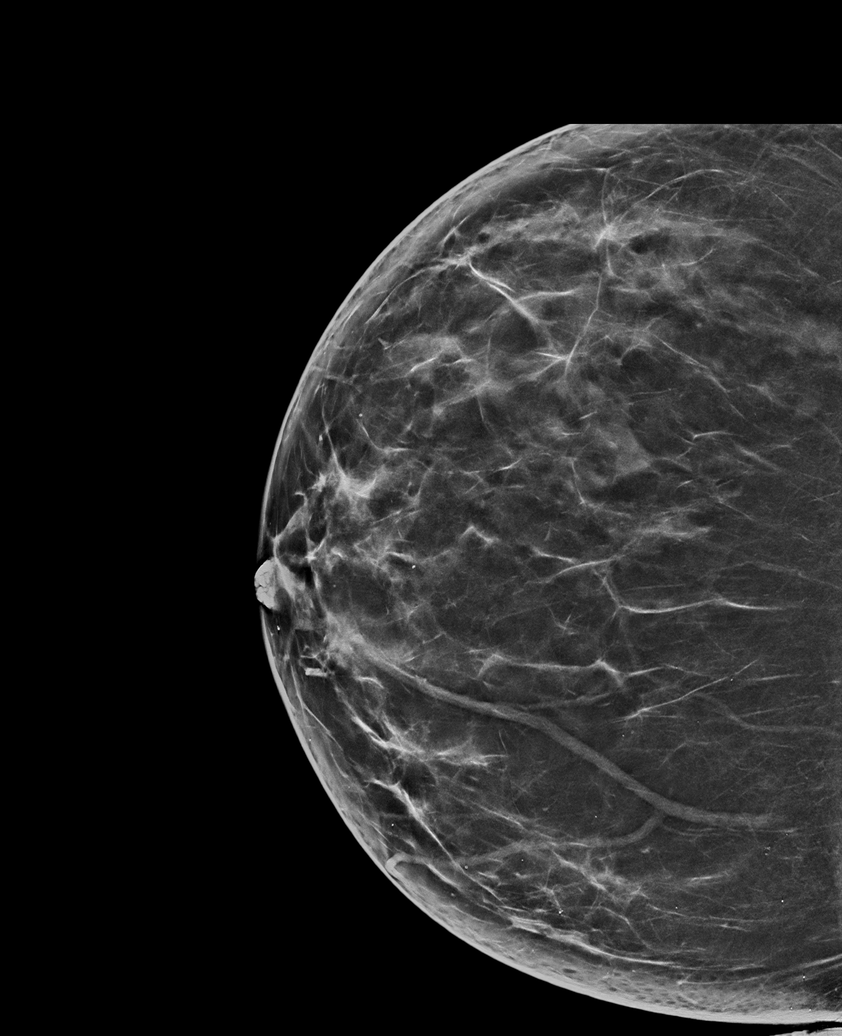

[R MLO synth-2D (1 of 2)]
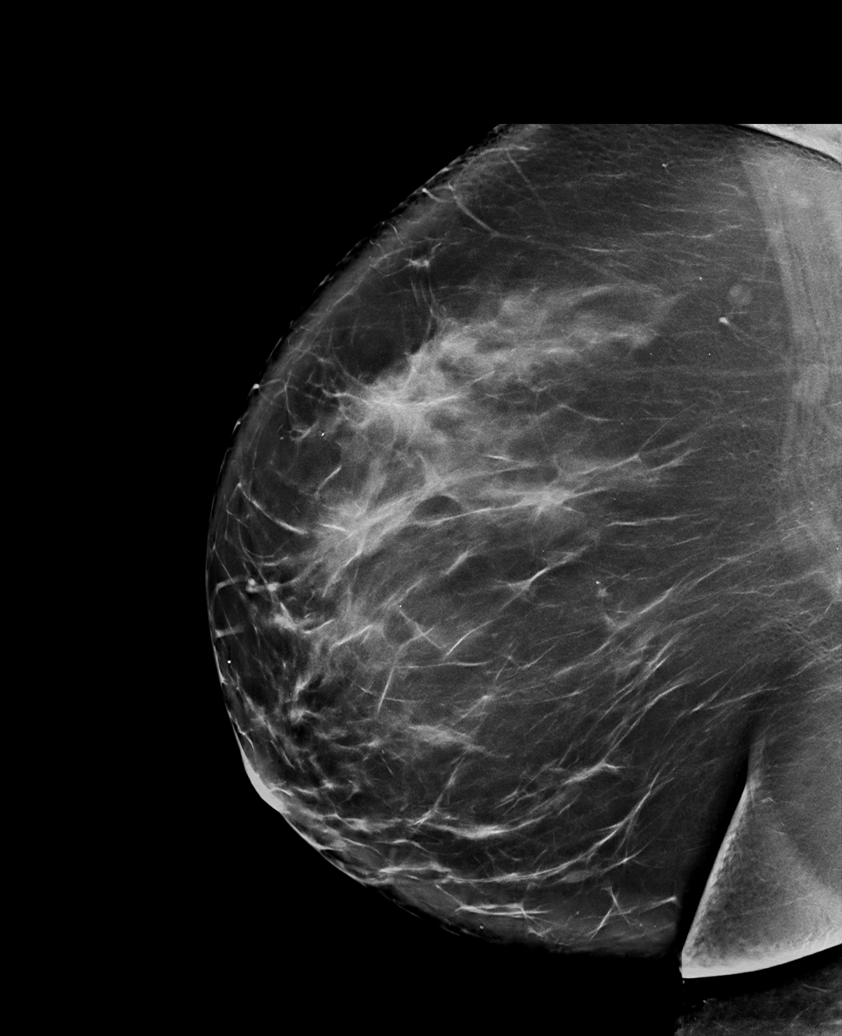

[R MLO synth-2D (2 of 2)]
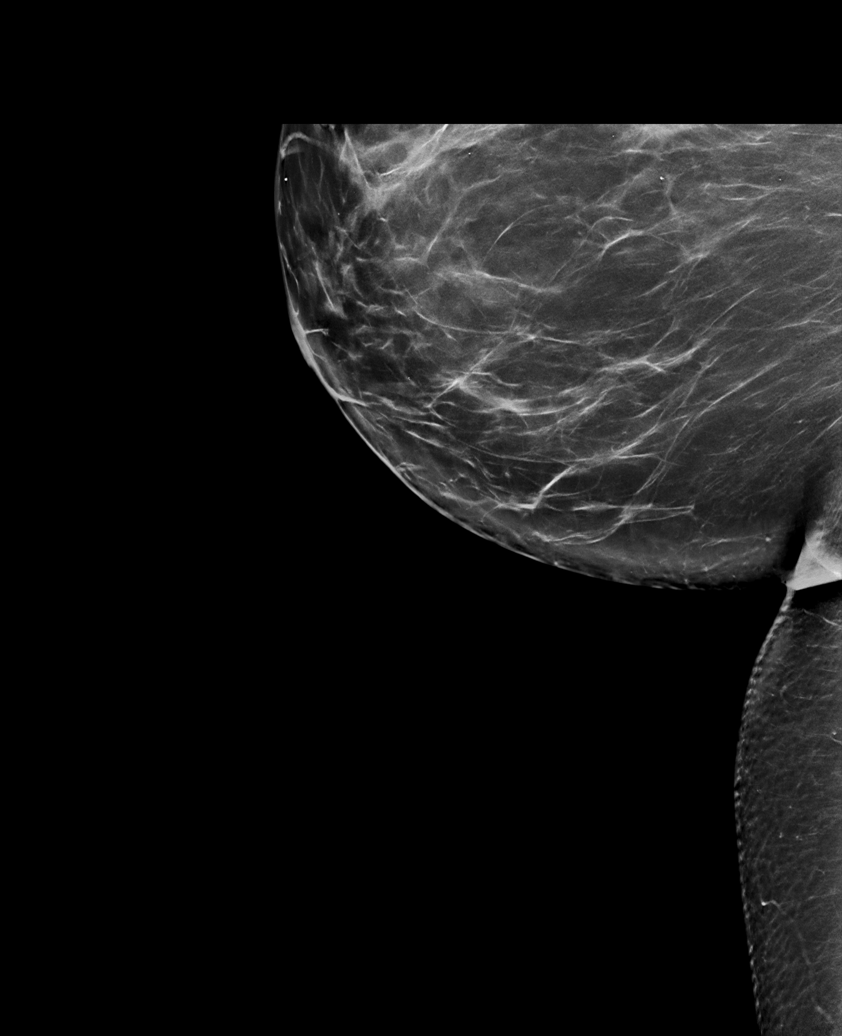

[L CC synth-2D]
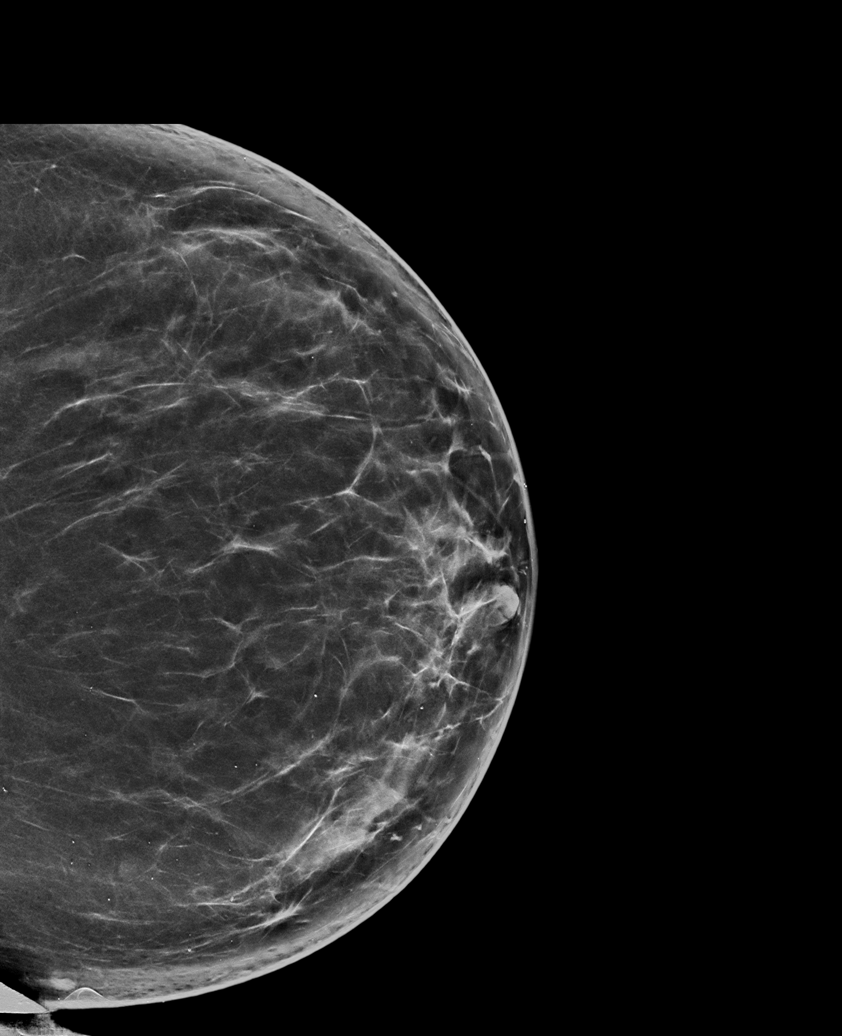

[L MLO synth-2D]
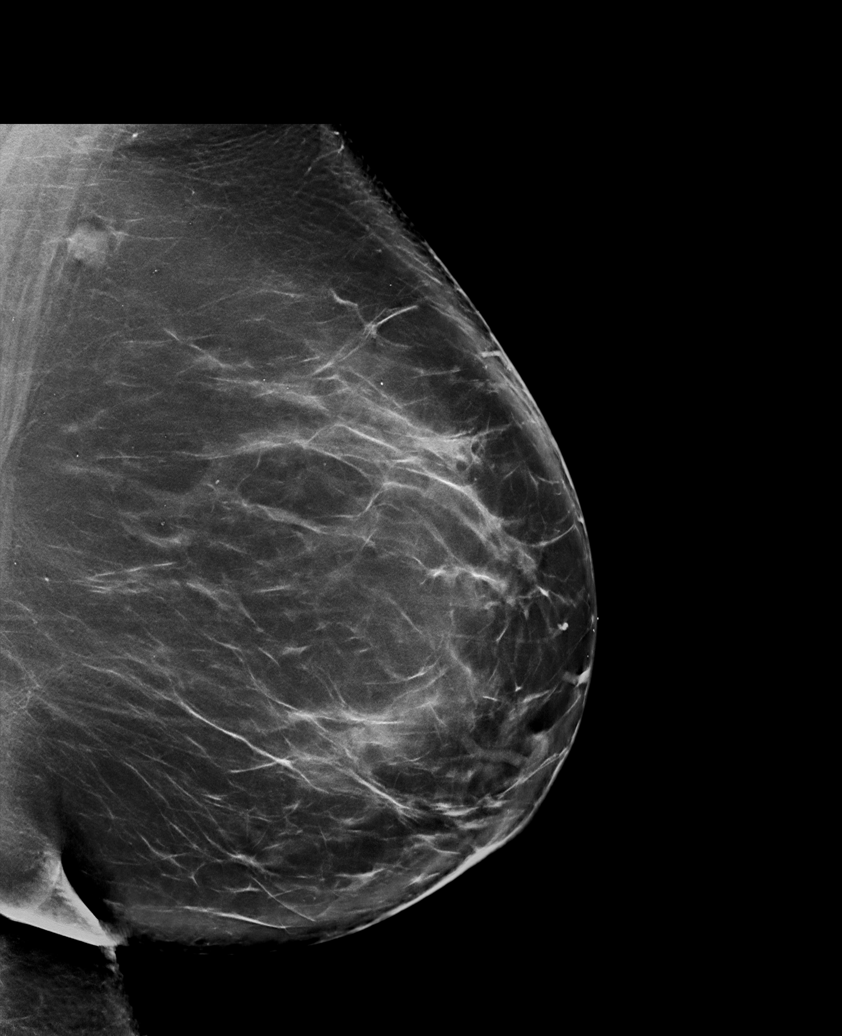

[R MLO tomo · tomo slice 55/110.0]
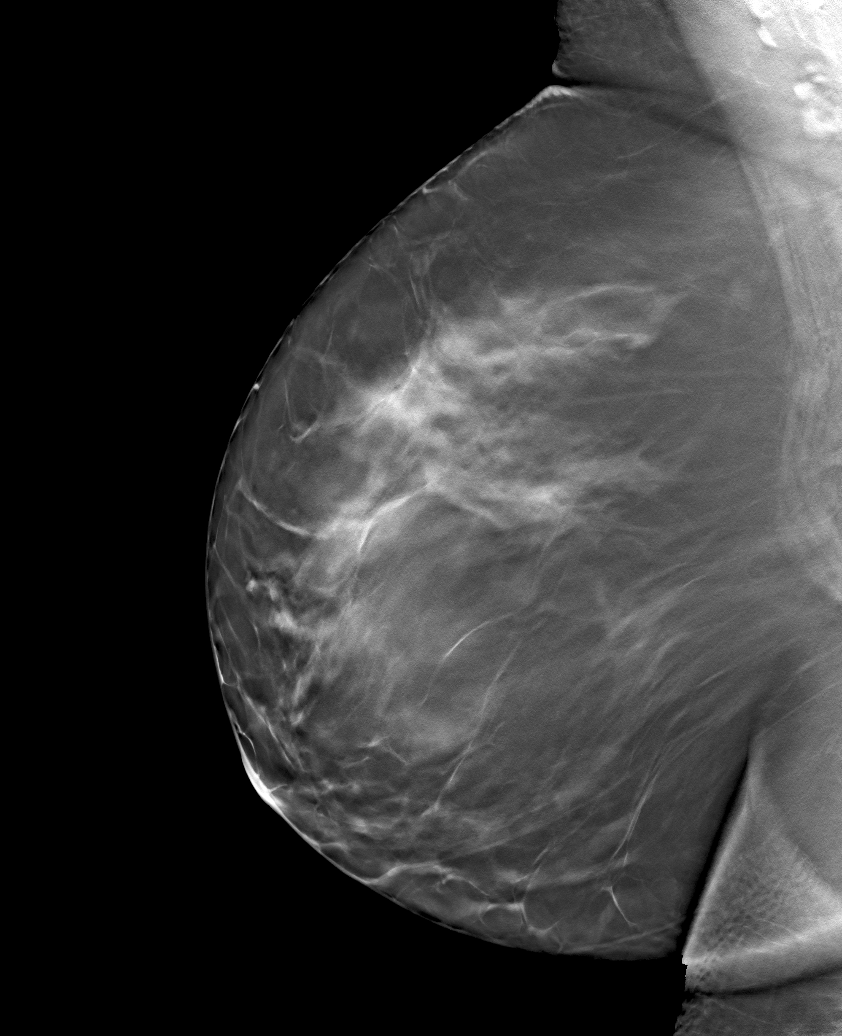

[6 of 30 positions shown; findings below may reference images not displayed]

ACR Breast Density Category c: The breast tissue is heterogeneously
dense, which may obscure small masses
FINDINGS: There are no findings suspicious for malignancy. The images were
evaluated with computer-aided detection.
IMPRESSION: No mammographic evidence of malignancy. A result letter of this
screening mammogram will be mailed directly to the patient.

RECOMMENDATION:
Screening mammogram in one year. (Code:CQ-1-UDH)

BI-RADS CATEGORY  1: Negative.

## 2022-01-24 ENCOUNTER — Ambulatory Visit: Payer: 59 | Admitting: Family Medicine

## 2022-01-24 VITALS — BP 133/87 | HR 89 | Temp 98.5°F | Ht 67.0 in | Wt 260.0 lb

## 2022-01-24 DIAGNOSIS — Z23 Encounter for immunization: Secondary | ICD-10-CM

## 2022-01-24 DIAGNOSIS — I10 Essential (primary) hypertension: Secondary | ICD-10-CM

## 2022-01-24 MED ORDER — ENALAPRIL MALEATE 5 MG PO TABS: 5.0000 mg | ORAL_TABLET | Freq: Every day | ORAL | 1 refills | Status: DC

## 2022-01-24 NOTE — Progress Notes (Signed)
Subjective:  Patient ID: Katherine Davila, female    DOB: 1975-11-05  Age: 46 y.o. MRN: [id number]  CC: Chief Complaint  Patient presents with   Follow-up    HPI:  46 year old female presents for follow-up.  Hypertension is well controlled on enalapril.  Patient needs refill.  Patient's hyperlipidemia has been untreated given ASCVD risk or and lack of other risk factors.  We have been monitoring.  Unchanged.  Patient states that she is feeling well.  She has no complaints or concerns at this time.  Discussed flu shot.  She will get this today.  Patient Active Problem List   Diagnosis Date Noted   Morbid obesity ([place]) 01/24/2022   Prediabetes 07/22/2021   Mixed hyperlipidemia 01/20/2021   Essential hypertension 04/08/2020   Urinary incontinence 10/03/2019   Bipolar disorder, mixed ([place]) 06/15/2019    Social Hx   Social History   Socioeconomic History   Marital status: Married    Spouse name: Not on file   Number of children: Not on file   Years of education: Not on file   Highest education level: Not on file  Occupational History   Occupation: nurse  Tobacco Use   Smoking status: Never   Smokeless tobacco: Never  Vaping Use   Vaping Use: Never used  Substance and Sexual Activity   Alcohol use: No   Drug use: No   Sexual activity: Yes    Birth control/protection: Surgical  Other Topics Concern   Not on file  Social History Narrative   Not on file   Social Determinants of Health   Financial Resource Strain: Low Risk  (04/27/2020)   Overall Financial Resource Strain (CARDIA)    Difficulty of Paying Living Expenses: Not hard at all  Food Insecurity: No Food Insecurity (04/27/2020)   Hunger Vital Sign    Worried About Running Out of Food in the Last Year: Never true    [place] in the Last Year: Never true  Transportation Needs: No Transportation Needs (04/27/2020)   PRAPARE - Hydrologist (Medical): No    Lack of  Transportation (Non-Medical): No  Physical Activity: Sufficiently Active (04/27/2020)   Exercise Vital Sign    Days of Exercise per Week: 4 days    Minutes of Exercise per Session: 100 min  Stress: Stress Concern Present (04/27/2020)   [place]    Feeling of Stress : Rather much  Social Connections: Socially Integrated (04/27/2020)   Social Connection and Isolation Panel [NHANES]    Frequency of Communication with Friends and Family: More than three times a week    Frequency of Social Gatherings with Friends and Family: Once a week    Attends Religious Services: More than 4 times per year    Active Member of [employer] or Organizations: Yes    Attends Music therapist: More than 4 times per year    Marital Status: Married    Review of Systems  Constitutional: Negative.   Respiratory: Negative.    Cardiovascular: Negative.    Objective:  BP 133/87   Pulse 89   Temp 98.5 F (36.9 C) (Oral)   Ht 5\' 7"  (1.702 m)   Wt 260 lb (117.9 kg)   LMP 08/22/2013   SpO2 98%   BMI 40.72 kg/m      01/24/2022   10:12 AM 07/29/2021   10:41 AM 07/22/2021    9:25 AM  BP/Weight  Systolic BP 133 126 130  Diastolic BP 87 87 76  Wt. (Lbs) 260 255.8 257  BMI 40.72 kg/m2 40.06 kg/m2 40.25 kg/m2    Physical Exam Vitals and nursing note reviewed.  Constitutional:      General: She is not in acute distress.    Appearance: Normal appearance. She is obese.  HENT:     Head: Normocephalic and atraumatic.  Eyes:     General:        Right eye: No discharge.        Left eye: No discharge.     Conjunctiva/sclera: Conjunctivae normal.  Cardiovascular:     Rate and Rhythm: Normal rate and regular rhythm.  Pulmonary:     Effort: Pulmonary effort is normal.     Breath sounds: Normal breath sounds. No wheezing, rhonchi or rales.  Neurological:     Mental Status: She is alert.  Psychiatric:        Mood and Affect: Mood  normal.        Behavior: Behavior normal.     Lab Results  Component Value Date   WBC 9.2 07/22/2021   HGB 14.5 07/22/2021   HCT 42.6 07/22/2021   PLT 277 07/22/2021   GLUCOSE 104 (H) 07/22/2021   CHOL 213 (H) 07/22/2021   TRIG 187 (H) 07/22/2021   HDL 46 07/22/2021   LDLCALC 134 (H) 07/22/2021   ALT 19 07/22/2021   AST 18 07/22/2021   NA 138 07/22/2021   K 4.0 07/22/2021   CL 99 07/22/2021   CREATININE 0.72 07/22/2021   BUN 8 07/22/2021   CO2 25 07/22/2021   TSH 0.928 11/28/2013   HGBA1C 5.8 (H) 07/22/2021     Assessment & Plan:   Problem List Items Addressed This Visit       Cardiovascular and Mediastinum   Essential hypertension - Primary    BP stable.  Continue enalapril.  Refilled today.      Relevant Medications   enalapril (VASOTEC) 5 MG tablet   Other Visit Diagnoses     Need for vaccination       Relevant Orders   Flu Vaccine QUAD 67mo+IM (Fluarix, Fluzone & Alfiuria Quad PF) (Completed)       Meds ordered this encounter  Medications   enalapril (VASOTEC) 5 MG tablet    Sig: Take 1 tablet (5 mg total) by mouth daily.    Dispense:  90 tablet    Refill:  1    PROFILE    Follow-up:  6 months  Carol Loftin Adriana Simas DO [facility]

## 2022-01-24 NOTE — Patient Instructions (Signed)
You're doing well.  Follow up in 6 months.  Take care  Dr. Dashonna Chagnon  

## 2022-01-24 NOTE — Assessment & Plan Note (Signed)
BP stable.  Continue enalapril.  Refilled today.

## 2022-11-06 ENCOUNTER — Ambulatory Visit
Admission: EM | Admit: 2022-11-06 | Discharge: 2022-11-06 | Disposition: A | Discharge status: Home / Self Care | Payer: 59 | Attending: Nurse Practitioner | Admitting: Nurse Practitioner

## 2022-11-06 DIAGNOSIS — Z1152 Encounter for screening for COVID-19: Secondary | ICD-10-CM | POA: Insufficient documentation

## 2022-11-06 DIAGNOSIS — J069 Acute upper respiratory infection, unspecified: Secondary | ICD-10-CM | POA: Insufficient documentation

## 2022-11-06 LAB — POCT INFLUENZA A/B
Influenza A, POC: NEGATIVE
Influenza B, POC: NEGATIVE

## 2022-11-06 MED ORDER — FLUTICASONE PROPIONATE 50 MCG/ACT NA SUSP: 2.0000 | Freq: Every day | NASAL | 0 refills | Status: AC

## 2022-11-06 MED ORDER — PSEUDOEPH-BROMPHEN-DM 30-2-10 MG/5ML PO SYRP: 5.0000 mL | ORAL_SOLUTION | Freq: Four times a day (QID) | ORAL | 0 refills | Status: DC | PRN

## 2022-11-06 NOTE — Discharge Instructions (Addendum)
The influenza test was negative.  COVID test is pending.  As discussed, you have declined antiviral treatment if your COVID test is positive.  You will have access to your results via MyChart. Take medication as prescribed.  Make sure you are taking your blood pressure medication while taking the cough medicine.  If your blood pressure becomes grossly elevated, stop medication and begin or over-the-counter Coricidin HBP. Increase fluids and allow for plenty of rest. Recommend Tylenol or ibuprofen as needed for pain, fever, or general discomfort. Warm salt water gargles 3-4 times daily to help with throat pain or discomfort. Recommend using a humidifier at bedtime during sleep to help with cough and nasal congestion. Sleep elevated on 2 pillows. If symptoms have not improved over the next 5 to 7 days, or if they appear to be worsening, please follow-up in this clinic or with your primary care physician for further evaluation. Follow-up if your symptoms do not improve.

## 2022-11-06 NOTE — ED Provider Notes (Signed)
[facility]    CSN: [id number] Arrival date & time: 11/06/22  0907      History   Chief Complaint Chief Complaint  Patient presents with   Generalized Body Aches   Cough    HPI Katherine Davila is a 47 y.o. female.   The history is provided by the patient.   Patient presents for complaints of fever, body aches, postnasal drip, and cough.  Patient states symptoms started approximately 4 days ago.  Tmax 101.4 approximately 2 days ago.  She denies headache, ear pain, sore throat, wheezing, shortness of breath, difficulty breathing, chest pain, abdominal pain, nausea, vomiting, or diarrhea.  Patient reports she has been taking Alka-Seltzer cold and flu Mucinex, and Robitussin for her symptoms.  Past Medical History:  Diagnosis Date   Abnormal Pap smear    Complication of anesthesia 2011   two attempts at spinal with last preg   Contraceptive management 08/26/2013   Hx of migraines    Hypertension    Vaginal Pap smear, abnormal     Patient Active Problem List   Diagnosis Date Noted   Morbid obesity (HCC) 01/24/2022   Prediabetes 07/22/2021   Mixed hyperlipidemia 01/20/2021   Essential hypertension 04/08/2020   Urinary incontinence 10/03/2019   Bipolar disorder, mixed (HCC) 06/15/2019    Past Surgical History:  Procedure Laterality Date   ABDOMINAL HYSTERECTOMY     APPENDECTOMY     CESAREAN SECTION     CESAREAN SECTION  04/06/2012   Procedure: CESAREAN SECTION;  Surgeon: Lazaro Arms, MD;  Location: [facility];  Service: Obstetrics;  Laterality: N/A;  prev c/s   cystotomy N/A    cystotomy repari   LAPAROSCOPIC SALPINGOOPHERECTOMY     right   WISDOM TOOTH EXTRACTION      OB History     Gravida  4   Para  3   Term  2   Preterm  1   AB  1   Living  3      SAB  1   IAB      Ectopic      Multiple      Live Births  3            Home Medications    Prior to Admission medications   Medication Sig Start Date End Date Taking?  Authorizing Provider  brompheniramine-pseudoephedrine-DM 30-2-10 MG/5ML syrup Take 5 mLs by mouth 4 (four) times daily as needed. 11/06/22  Yes Mahlia Fernando-Warren, Sadie Haber, NP  fluticasone (FLONASE) 50 MCG/ACT nasal spray Place 2 sprays into both nostrils daily. 11/06/22  Yes Natha Guin-Warren, Sadie Haber, NP  diphenhydrAMINE (BENADRYL) 25 MG tablet Take 25 mg by mouth as needed.    [provider]  enalapril (VASOTEC) 5 MG tablet Take 1 tablet (5 mg total) by mouth daily. 01/24/22   Tommie Sams, DO  ondansetron (ZOFRAN) 4 MG tablet Take 1 tablet (4 mg total) by mouth every 8 (eight) hours as needed for nausea or vomiting. 07/22/21   Tommie Sams, DO    Family History Family History  Problem Relation Age of Onset   Cancer Father        head, neck   Hypertension Father    Dementia Maternal Grandmother    Heart disease Maternal Grandmother    Hypertension Maternal Grandmother    Dementia Paternal Grandmother     Social History Social History   Tobacco Use   Smoking status: Never   Smokeless tobacco: Never  Vaping Use   Vaping status: Never Used  Substance Use Topics   Alcohol use: No   Drug use: No     Allergies   Erythromycin, Penicillins, and Iohexol   Review of Systems Review of Systems Per HPI  Physical Exam Triage Vital Signs ED Triage Vitals  Encounter Vitals Group     BP 11/06/22 1000 (!) 146/100     Systolic BP Percentile --      Diastolic BP Percentile --      Pulse Rate 11/06/22 1000 96     Resp 11/06/22 1000 20     Temp 11/06/22 1000 98.9 F (37.2 C)     Temp Source 11/06/22 1000 Oral     SpO2 11/06/22 1000 94 %     Weight --      Height --      Head Circumference --      Peak Flow --      Pain Score 11/06/22 0959 4     Pain Loc --      Pain Education --      Exclude from Growth Chart --    No data found.  Updated Vital Signs BP (!) 152/79 (BP Location: Right Arm)   Pulse 96   Temp 98.9 F (37.2 C) (Oral)   Resp 20   LMP 08/22/2013    SpO2 94%   Visual Acuity Right Eye Distance:   Left Eye Distance:   Bilateral Distance:    Right Eye Near:   Left Eye Near:    Bilateral Near:     Physical Exam Vitals and nursing note reviewed.  Constitutional:      General: She is not in acute distress.    Appearance: Normal appearance.  HENT:     Head: Normocephalic.     Right Ear: Tympanic membrane, ear canal and external ear normal.     Left Ear: Tympanic membrane, ear canal and external ear normal.     Nose: Nose normal.     Mouth/Throat:     Mouth: Mucous membranes are moist.     Pharynx: Posterior oropharyngeal erythema present.     Comments: Cobblestoning present in the posterior oropharnyx Eyes:     Extraocular Movements: Extraocular movements intact.     Conjunctiva/sclera: Conjunctivae normal.     Pupils: Pupils are equal, round, and reactive to light.  Cardiovascular:     Rate and Rhythm: Normal rate and regular rhythm.     Pulses: Normal pulses.     Heart sounds: Normal heart sounds.  Pulmonary:     Effort: Pulmonary effort is normal. No respiratory distress.     Breath sounds: Normal breath sounds. No stridor. No wheezing, rhonchi or rales.  Chest:     Chest wall: No tenderness.  Abdominal:     General: Bowel sounds are normal.     Palpations: Abdomen is soft.     Tenderness: There is no abdominal tenderness.  Musculoskeletal:     Cervical back: Normal range of motion.  Lymphadenopathy:     Cervical: No cervical adenopathy.  Skin:    General: Skin is warm and dry.  Neurological:     General: No focal deficit present.     Mental Status: She is alert and oriented to person, place, and time.  Psychiatric:        Mood and Affect: Mood normal.        Behavior: Behavior normal.      UC Treatments / Results  Labs (all  labs ordered are listed, but only abnormal results are displayed) Labs Reviewed  SARS CORONAVIRUS 2 (TAT 6-24 HRS)  POCT INFLUENZA A/B    EKG   Radiology No results  found.  Procedures Procedures (including critical care time)  Medications Ordered in UC Medications - No data to display  Initial Impression / Assessment and Plan / UC Course  I have reviewed the triage vital signs and the nursing notes.  Pertinent labs & imaging results that were available during my care of the patient were reviewed by me and considered in my medical decision making (see chart for details).  The patient is well-appearing, she is in no acute distress, vital signs are stable.  Influenza test was negative, COVID test is pending.  Patient declines antiviral therapy if her COVID test is positive.  Will provide symptomatic treatment for viral upper story infection with Bromfed-DM for her cough, and fluticasone 50 mcg  nasal spray for postnasal drip.  Supportive care recommendations were provided and discussed with the patient to include over-the-counter analgesics for pain or discomfort, use of a humidifier in her bedroom at nighttime during sleep, increasing fluids and allowing for plenty of rest, and use of a humidifier in her bedroom during sleep.  Discussed viral etiology with the patient and when follow-up may be indicated.  Patient was in agreement with this plan of care and verbalizes understanding.  All questions were answered.  Patient stable for discharge.  Work note was provided.  Final Clinical Impressions(s) / UC Diagnoses   Final diagnoses:  Viral upper respiratory tract infection with cough  Encounter for screening for COVID-19     Discharge Instructions      The influenza test was negative.  COVID test is pending.  As discussed, you have declined antiviral treatment if your COVID test is positive.  You will have access to your results via MyChart. Take medication as prescribed.  Make sure you are taking your blood pressure medication while taking the cough medicine.  If your blood pressure becomes grossly elevated, stop medication and begin or over-the-counter  Coricidin HBP. Increase fluids and allow for plenty of rest. Recommend Tylenol or ibuprofen as needed for pain, fever, or general discomfort. Warm salt water gargles 3-4 times daily to help with throat pain or discomfort. Recommend using a humidifier at bedtime during sleep to help with cough and nasal congestion. Sleep elevated on 2 pillows. If symptoms have not improved over the next 5 to 7 days, or if they appear to be worsening, please follow-up in this clinic or with your primary care physician for further evaluation. Follow-up if your symptoms do not improve.      ED Prescriptions     Medication Sig Dispense Auth. Provider   brompheniramine-pseudoephedrine-DM 30-2-10 MG/5ML syrup Take 5 mLs by mouth 4 (four) times daily as needed. 140 mL Camry Robello-Warren, Sadie Haber, NP   fluticasone (FLONASE) 50 MCG/ACT nasal spray Place 2 sprays into both nostrils daily. 16 g Kalyssa Anker-Warren, Sadie Haber, NP      PDMP not reviewed this encounter.   Abran Cantor, NP 11/06/22 [phone]

## 2022-11-06 NOTE — ED Triage Notes (Signed)
Pt reports having body aches, a tickle in her throat, and cough.   Started: Wednesday  Home interventions: alka seltzer cold and flu, mucinex, robitussin

## 2022-11-07 LAB — SARS CORONAVIRUS 2 (TAT 6-24 HRS): SARS Coronavirus 2: NEGATIVE

## 2022-12-10 ENCOUNTER — Ambulatory Visit (INDEPENDENT_AMBULATORY_CARE_PROVIDER_SITE_OTHER): Payer: 59 | Admitting: *Deleted

## 2022-12-10 DIAGNOSIS — Z23 Encounter for immunization: Secondary | ICD-10-CM | POA: Diagnosis not present

## 2023-01-17 ENCOUNTER — Ambulatory Visit (INDEPENDENT_AMBULATORY_CARE_PROVIDER_SITE_OTHER): Payer: 59 | Admitting: Family Medicine

## 2023-01-17 VITALS — BP 138/96 | HR 85 | Temp 98.2°F | Ht 67.0 in | Wt 252.8 lb

## 2023-01-17 DIAGNOSIS — I10 Essential (primary) hypertension: Secondary | ICD-10-CM | POA: Diagnosis not present

## 2023-01-17 DIAGNOSIS — F3160 Bipolar disorder, current episode mixed, unspecified: Secondary | ICD-10-CM | POA: Diagnosis not present

## 2023-01-17 DIAGNOSIS — Z0001 Encounter for general adult medical examination with abnormal findings: Secondary | ICD-10-CM

## 2023-01-17 DIAGNOSIS — R7303 Prediabetes: Secondary | ICD-10-CM

## 2023-01-17 DIAGNOSIS — E782 Mixed hyperlipidemia: Secondary | ICD-10-CM | POA: Diagnosis not present

## 2023-01-17 DIAGNOSIS — Z0000 Encounter for general adult medical examination without abnormal findings: Secondary | ICD-10-CM | POA: Insufficient documentation

## 2023-01-17 DIAGNOSIS — Z130 Encounter for screening for diseases of the blood and blood-forming organs and certain disorders involving the immune mechanism: Secondary | ICD-10-CM

## 2023-01-17 MED ORDER — AMLODIPINE BESYLATE 5 MG PO TABS: 5.0000 mg | ORAL_TABLET | Freq: Every day | ORAL | 3 refills | Status: DC

## 2023-01-17 MED ORDER — ARIPIPRAZOLE 5 MG PO TABS: 5.0000 mg | ORAL_TABLET | Freq: Every day | ORAL | 3 refills | Status: DC

## 2023-01-17 NOTE — Assessment & Plan Note (Signed)
Patient has got her flu shot.  Labs today.  Has had a hysterectomy and therefore does not need cervical cancer screening.  In need of colon cancer screening.

## 2023-01-17 NOTE — Patient Instructions (Signed)
Medications as prescribed.  Follow up in 6 weeks.  Labs today.

## 2023-01-17 NOTE — Assessment & Plan Note (Signed)
Starting Abilify.

## 2023-01-17 NOTE — Progress Notes (Signed)
Subjective:  Patient ID: Katherine Davila, female    DOB: May 04, 1975  Age: 47 y.o. MRN: [id number]  CC:  Physical  HPI:  47 year old female presents for a physical exam.  Patient's preventative health care is up-to-date excluding colonoscopy.  Patient needs a screening labs.  Patient's blood pressure elevated here today.  She has not been taking her enalapril.  Additionally, patient reports ongoing stress.  Patient states that she feels quite overwhelmed.  Patient's spouse is often out of town therefore she is the sole caregiver for her children.  She recently lost her father and she is still grieving.  Patient has a longstanding history of mental health issues.  Has a history of bipolar disorder.  States that she feels like she needs treatment at this time.  PHQ-9 score of 10.  GAD-7 score of 13.  Patient Active Problem List   Diagnosis Date Noted   Annual physical exam 01/17/2023   Morbid obesity (HCC) 01/24/2022   Prediabetes 07/22/2021   Mixed hyperlipidemia 01/20/2021   Essential hypertension 04/08/2020   Urinary incontinence 10/03/2019   Bipolar disorder, mixed (HCC) 06/15/2019    Social Hx   Social History   Socioeconomic History   Marital status: Married    Spouse name: Not on file   Number of children: Not on file   Years of education: Not on file   Highest education level: Not on file  Occupational History   Occupation: nurse  Tobacco Use   Smoking status: Never   Smokeless tobacco: Never  Vaping Use   Vaping status: Never Used  Substance and Sexual Activity   Alcohol use: No   Drug use: No   Sexual activity: Yes    Birth control/protection: Surgical  Other Topics Concern   Not on file  Social History Narrative   Not on file   Social Determinants of Health   Financial Resource Strain: Low Risk  (04/27/2020)   Overall Financial Resource Strain (CARDIA)    Difficulty of Paying Living Expenses: Not hard at all  Food Insecurity: No Food Insecurity  (04/27/2020)   Hunger Vital Sign    Worried About Running Out of Food in the Last Year: Never true    Ran Out of Food in the Last Year: Never true  Transportation Needs: No Transportation Needs (04/27/2020)   PRAPARE - Administrator, Civil Service (Medical): No    Lack of Transportation (Non-Medical): No  Physical Activity: Sufficiently Active (04/27/2020)   Exercise Vital Sign    Days of Exercise per Week: 4 days    Minutes of Exercise per Session: 100 min  Stress: Stress Concern Present (04/27/2020)   [employer] of Occupational Health - Occupational Stress Questionnaire    Feeling of Stress : Rather much  Social Connections: Socially Integrated (04/27/2020)   Social Connection and Isolation Panel [NHANES]    Frequency of Communication with Friends and Family: More than three times a week    Frequency of Social Gatherings with Friends and Family: Once a week    Attends Religious Services: More than 4 times per year    Active Member of [employer] or Organizations: Yes    Attends Engineer, structural: More than 4 times per year    Marital Status: Married    Review of Systems Per HPI  Objective:  BP (!) 138/96   Pulse 85   Temp 98.2 F (36.8 C)   Ht 5\' 7"  (1.702 m)   Wt 252 lb 12.8  oz (114.7 kg)   LMP 08/22/2013   SpO2 100%   BMI 39.59 kg/m      01/17/2023    9:32 AM 01/17/2023    9:00 AM 11/06/2022   10:02 AM  BP/Weight  Systolic BP 138 143 152  Diastolic BP 96 84 79  Wt. (Lbs)  252.8   BMI  39.59 kg/m2     Physical Exam Vitals and nursing note reviewed.  Constitutional:      Appearance: Normal appearance. She is obese.  HENT:     Head: Normocephalic and atraumatic.     Nose: Nose normal.  Eyes:     General:        Right eye: No discharge.        Left eye: No discharge.     Conjunctiva/sclera: Conjunctivae normal.  Cardiovascular:     Rate and Rhythm: Normal rate and regular rhythm.  Pulmonary:     Effort: Pulmonary effort is normal.      Breath sounds: Normal breath sounds. No wheezing, rhonchi or rales.  Abdominal:     Palpations: Abdomen is soft.     Tenderness: There is no abdominal tenderness.  Neurological:     Mental Status: She is alert.  Psychiatric:        Behavior: Behavior normal.     Comments: Tearful.     Lab Results  Component Value Date   WBC 9.2 07/22/2021   HGB 14.5 07/22/2021   HCT 42.6 07/22/2021   PLT 277 07/22/2021   GLUCOSE 104 (H) 07/22/2021   CHOL 213 (H) 07/22/2021   TRIG 187 (H) 07/22/2021   HDL 46 07/22/2021   LDLCALC 134 (H) 07/22/2021   ALT 19 07/22/2021   AST 18 07/22/2021   NA 138 07/22/2021   K 4.0 07/22/2021   CL 99 07/22/2021   CREATININE 0.72 07/22/2021   BUN 8 07/22/2021   CO2 25 07/22/2021   TSH 0.928 11/28/2013   HGBA1C 5.8 (H) 07/22/2021     Assessment & Plan:   Problem List Items Addressed This Visit       Cardiovascular and Mediastinum   Essential hypertension    Uncontrolled.  Starting amlodipine.  Labs today.      Relevant Medications   amLODipine (NORVASC) 5 MG tablet   Other Relevant Orders   CMP14+EGFR     Other   Prediabetes   Relevant Orders   Hemoglobin A1c   Mixed hyperlipidemia   Relevant Medications   amLODipine (NORVASC) 5 MG tablet   Other Relevant Orders   Lipid panel   Bipolar disorder, mixed (HCC)    Starting Abilify.      Annual physical exam - Primary    Patient has got her flu shot.  Labs today.  Has had a hysterectomy and therefore does not need cervical cancer screening.  In need of colon cancer screening.      Other Visit Diagnoses     Screening for deficiency anemia       Relevant Orders   CBC       Meds ordered this encounter  Medications   amLODipine (NORVASC) 5 MG tablet    Sig: Take 1 tablet (5 mg total) by mouth daily.    Dispense:  90 tablet    Refill:  3   ARIPiprazole (ABILIFY) 5 MG tablet    Sig: Take 1 tablet (5 mg total) by mouth daily.    Dispense:  90 tablet    Refill:  3  Follow-up:  Return in about 6 weeks (around 02/28/2023).  Everlene Other DO [facility]

## 2023-01-17 NOTE — Assessment & Plan Note (Signed)
Uncontrolled.  Starting amlodipine.  Labs today.

## 2023-01-18 LAB — HEMOGLOBIN A1C
Est. average glucose Bld gHb Est-mCnc: 126 mg/dL
Hgb A1c MFr Bld: 6 % — ABNORMAL HIGH (ref 4.8–5.6)

## 2023-01-18 LAB — CMP14+EGFR
ALT: 14 [IU]/L (ref 0–32)
AST: 14 [IU]/L (ref 0–40)
Albumin: 4.1 g/dL (ref 3.9–4.9)
Alkaline Phosphatase: 92 [IU]/L (ref 44–121)
BUN/Creatinine Ratio: 17 (ref 9–23)
BUN: 14 mg/dL (ref 6–24)
Bilirubin Total: 0.3 mg/dL (ref 0.0–1.2)
CO2: 23 mmol/L (ref 20–29)
Calcium: 9.7 mg/dL (ref 8.7–10.2)
Chloride: 101 mmol/L (ref 96–106)
Creatinine, Ser: 0.83 mg/dL (ref 0.57–1.00)
Globulin, Total: 2.8 g/dL (ref 1.5–4.5)
Glucose: 91 mg/dL (ref 70–99)
Potassium: 4.1 mmol/L (ref 3.5–5.2)
Sodium: 141 mmol/L (ref 134–144)
Total Protein: 6.9 g/dL (ref 6.0–8.5)
eGFR: 89 mL/min/{1.73_m2} (ref >59)

## 2023-01-18 LAB — CBC
Hematocrit: 44.4 % (ref 34.0–46.6)
Hemoglobin: 15.1 g/dL (ref 11.1–15.9)
MCH: 31.7 pg (ref 26.6–33.0)
MCHC: 34 g/dL (ref 31.5–35.7)
MCV: 93 fL (ref 79–97)
Platelets: 302 10*3/uL (ref 150–450)
RBC: 4.76 x10E6/uL (ref 3.77–5.28)
RDW: 12.7 % (ref 11.7–15.4)
WBC: 7.8 10*3/uL (ref 3.4–10.8)

## 2023-01-18 LAB — LIPID PANEL
Chol/HDL Ratio: 4.8 ratio — ABNORMAL HIGH (ref 0.0–4.4)
Cholesterol, Total: 231 mg/dL — ABNORMAL HIGH (ref 100–199)
HDL: 48 mg/dL (ref >39)
LDL Chol Calc (NIH): 161 mg/dL — ABNORMAL HIGH (ref 0–99)
Triglycerides: 122 mg/dL (ref 0–149)
VLDL Cholesterol Cal: 22 mg/dL (ref 5–40)

## 2023-01-27 ENCOUNTER — Encounter: Payer: Self-pay | Admitting: Family Medicine

## 2023-01-29 ENCOUNTER — Other Ambulatory Visit: Payer: Self-pay | Admitting: Family Medicine

## 2023-01-29 MED ORDER — ROSUVASTATIN CALCIUM 10 MG PO TABS: 10.0000 mg | ORAL_TABLET | Freq: Every day | ORAL | 3 refills | Status: DC

## 2023-03-06 ENCOUNTER — Ambulatory Visit: Payer: 59 | Admitting: Family Medicine

## 2023-03-07 ENCOUNTER — Ambulatory Visit: Payer: 59 | Admitting: Family Medicine

## 2023-03-16 ENCOUNTER — Ambulatory Visit: Payer: 59 | Admitting: Family Medicine

## 2023-03-16 ENCOUNTER — Encounter: Payer: Self-pay | Admitting: Family Medicine

## 2023-03-16 VITALS — BP 120/82 | HR 80 | Ht 67.0 in | Wt 260.0 lb

## 2023-03-16 DIAGNOSIS — Z1211 Encounter for screening for malignant neoplasm of colon: Secondary | ICD-10-CM | POA: Diagnosis not present

## 2023-03-16 DIAGNOSIS — F3160 Bipolar disorder, current episode mixed, unspecified: Secondary | ICD-10-CM | POA: Diagnosis not present

## 2023-03-16 DIAGNOSIS — I10 Essential (primary) hypertension: Secondary | ICD-10-CM | POA: Diagnosis not present

## 2023-03-16 NOTE — Assessment & Plan Note (Signed)
Stable. Continue Amlodipine. 

## 2023-03-16 NOTE — Progress Notes (Signed)
 Subjective:  Patient ID: Katherine Davila, female    DOB: 07-Feb-1976  Age: 48 y.o. MRN: [id number]  CC:   Chief Complaint  Patient presents with   Follow-up    6 wk. F/u     HPI:  48 year old female presents for follow-up.  She states that her mood is doing okay.  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  Compliant with Abilify .  Hypertension stable on amlodipine .  Patient denies any other complaints or concerns at this time.  Patient Active Problem List   Diagnosis Date Noted   Annual physical exam 01/17/2023   Morbid obesity (HCC) 01/24/2022   Prediabetes 07/22/2021   Mixed hyperlipidemia 01/20/2021   Essential hypertension 04/08/2020   Urinary incontinence 10/03/2019   Bipolar disorder, mixed (HCC) 06/15/2019    Social Hx   Social History   Socioeconomic History   Marital status: Married    Spouse name: Not on file   Number of children: Not on file   Years of education: Not on file   Highest education level: Not on file  Occupational History   Occupation: nurse  Tobacco Use   Smoking status: Never   Smokeless tobacco: Never  Vaping Use   Vaping status: Never Used  Substance and Sexual Activity   Alcohol use: No   Drug use: No   Sexual activity: Yes    Birth control/protection: Surgical  Other Topics Concern   Not on file  Social History Narrative   Not on file   Social Drivers of Health   Financial Resource Strain: Low Risk  (04/27/2020)   Overall Financial Resource Strain (CARDIA)    Difficulty of Paying Living Expenses: Not hard at all  Food Insecurity: No Food Insecurity (04/27/2020)   Hunger Vital Sign    Worried About Running Out of Food in the Last Year: Never true    Ran Out of Food in the Last Year: Never true  Transportation Needs: No Transportation Needs (04/27/2020)   PRAPARE - Administrator, Civil Service (Medical): No    Lack of Transportation (Non-Medical): No  Physical Activity:  Sufficiently Active (04/27/2020)   Exercise Vital Sign    Days of Exercise per Week: 4 days    Minutes of Exercise per Session: 100 min  Stress: Stress Concern Present (04/27/2020)   [employer] of Occupational Health - Occupational Stress Questionnaire    Feeling of Stress : Rather much  Social Connections: Socially Integrated (04/27/2020)   Social Connection and Isolation Panel [NHANES]    Frequency of Communication with Friends and Family: More than three times a week    Frequency of Social Gatherings with Friends and Family: Once a week    Attends Religious Services: More than 4 times per year    Active Member of [employer] or Organizations: Yes    Attends Engineer, Structural: More than 4 times per year    Marital Status: Married    Review of Systems Per HPI  Objective:  BP 120/82   Pulse 80   Ht 5' 7 (1.702 m)   Wt 260 lb (117.9 kg)   LMP 08/22/2013   SpO2 96%   BMI 40.72 kg/m      03/16/2023    1:08 PM 01/17/2023    9:32 AM 01/17/2023    9:00 AM  BP/Weight  Systolic BP 120 138 143  Diastolic BP 82 96 84  Wt. (Lbs) 260  252.8  BMI 40.72 kg/m2  39.59 kg/m2    Physical Exam Vitals and nursing note reviewed.  Constitutional:      General: She is not in acute distress.    Appearance: Normal appearance.  HENT:     Head: Normocephalic and atraumatic.  Eyes:     General:        Right eye: No discharge.        Left eye: No discharge.     Conjunctiva/sclera: Conjunctivae normal.  Cardiovascular:     Rate and Rhythm: Normal rate and regular rhythm.  Pulmonary:     Effort: Pulmonary effort is normal. No respiratory distress.  Neurological:     Mental Status: She is alert.  Psychiatric:        Mood and Affect: Mood normal.        Behavior: Behavior normal.     Lab Results  Component Value Date   WBC 7.8 01/17/2023   HGB 15.1 01/17/2023   HCT 44.4 01/17/2023   PLT 302 01/17/2023   GLUCOSE 91 01/17/2023   CHOL 231 (H) 01/17/2023   TRIG 122  01/17/2023   HDL 48 01/17/2023   LDLCALC 161 (H) 01/17/2023   ALT 14 01/17/2023   AST 14 01/17/2023   NA 141 01/17/2023   K 4.1 01/17/2023   CL 101 01/17/2023   CREATININE 0.83 01/17/2023   BUN 14 01/17/2023   CO2 23 01/17/2023   TSH 0.928 11/28/2013   HGBA1C 6.0 (H) 01/17/2023     Assessment & Plan:   Problem List Items Addressed This Visit       Cardiovascular and Mediastinum   Essential hypertension - Primary   Stable. Continue Amlodipine .        Other   Bipolar disorder, mixed (HCC)   Stable. Continue Abilify .      Other Visit Diagnoses       Colon cancer screening       Relevant Orders   Cologuard      Follow-up:  Return in about 6 months (around 09/13/2023).  Jacqulyn Ahle DO [facility]

## 2023-03-16 NOTE — Patient Instructions (Signed)
Continue your medication  Follow up in 6 months  

## 2023-03-16 NOTE — Assessment & Plan Note (Signed)
 Stable. Continue Abilify.

## 2023-03-31 DIAGNOSIS — Z1211 Encounter for screening for malignant neoplasm of colon: Secondary | ICD-10-CM | POA: Diagnosis not present

## 2023-04-07 ENCOUNTER — Encounter: Payer: Self-pay | Admitting: Family Medicine

## 2023-04-07 LAB — COLOGUARD: COLOGUARD: NEGATIVE

## 2023-04-26 DIAGNOSIS — Z818 Family history of other mental and behavioral disorders: Secondary | ICD-10-CM | POA: Diagnosis not present

## 2023-04-26 DIAGNOSIS — I10 Essential (primary) hypertension: Secondary | ICD-10-CM | POA: Diagnosis not present

## 2023-04-26 DIAGNOSIS — Z91041 Radiographic dye allergy status: Secondary | ICD-10-CM | POA: Diagnosis not present

## 2023-04-26 DIAGNOSIS — F319 Bipolar disorder, unspecified: Secondary | ICD-10-CM | POA: Diagnosis not present

## 2023-04-26 DIAGNOSIS — Z880 Allergy status to penicillin: Secondary | ICD-10-CM | POA: Diagnosis not present

## 2023-04-26 DIAGNOSIS — Z823 Family history of stroke: Secondary | ICD-10-CM | POA: Diagnosis not present

## 2023-04-26 DIAGNOSIS — R7303 Prediabetes: Secondary | ICD-10-CM | POA: Diagnosis not present

## 2023-04-26 DIAGNOSIS — E785 Hyperlipidemia, unspecified: Secondary | ICD-10-CM | POA: Diagnosis not present

## 2023-04-26 DIAGNOSIS — J309 Allergic rhinitis, unspecified: Secondary | ICD-10-CM | POA: Diagnosis not present

## 2023-04-26 DIAGNOSIS — Z881 Allergy status to other antibiotic agents status: Secondary | ICD-10-CM | POA: Diagnosis not present

## 2023-04-26 DIAGNOSIS — Z8249 Family history of ischemic heart disease and other diseases of the circulatory system: Secondary | ICD-10-CM | POA: Diagnosis not present

## 2023-04-26 DIAGNOSIS — R32 Unspecified urinary incontinence: Secondary | ICD-10-CM | POA: Diagnosis not present

## 2023-09-04 ENCOUNTER — Other Ambulatory Visit: Payer: Self-pay | Admitting: Family Medicine

## 2023-09-13 ENCOUNTER — Ambulatory Visit: Payer: 59 | Admitting: Family Medicine

## 2023-09-26 ENCOUNTER — Ambulatory Visit: Payer: Self-pay | Admitting: Family Medicine

## 2023-09-28 ENCOUNTER — Encounter: Payer: Self-pay | Admitting: Physician Assistant

## 2023-09-28 ENCOUNTER — Ambulatory Visit: Payer: Self-pay | Admitting: Physician Assistant

## 2023-09-28 VITALS — BP 138/81 | HR 61 | Temp 98.2°F | Ht 67.0 in | Wt 265.0 lb

## 2023-09-28 DIAGNOSIS — F411 Generalized anxiety disorder: Secondary | ICD-10-CM

## 2023-09-28 MED ORDER — ONDANSETRON HCL 4 MG PO TABS: 4.0000 mg | ORAL_TABLET | Freq: Three times a day (TID) | ORAL | 3 refills | Status: AC | PRN

## 2023-09-28 MED ORDER — BUSPIRONE HCL 7.5 MG PO TABS
7.5000 mg | ORAL_TABLET | Freq: Two times a day (BID) | ORAL | 1 refills | Status: DC
Start: 2023-09-28 — End: 2023-11-09

## 2023-09-28 NOTE — Assessment & Plan Note (Signed)
 Patient presents today with worsening anxiety. Discussed treatment options. Referral for therapy. Starting Buspar  7.5 mg bid. Patient counseled on side effects such as nausea and headache, she was advised to stop taking medication if she experiences suicidal ideation. Patient to follow up in 6 weeks.

## 2023-09-28 NOTE — Progress Notes (Signed)
 Established Patient Office Visit  Subjective   Patient ID: Katherine Davila, female    DOB: [birth year]  Age: 48 y.o. MRN: [id number]  Chief Complaint  Patient presents with   Anxiety    Severe anxiety. Throwing up at the thought of leaving the house.     Patient presents today with concerns for worsening anxiety and panic attacks. She reports long history of anxiety, however over the last 3 weeks has experienced worsening symptoms. States she is throwing up at the thought of leaving the house or having a task to get done. Interestingly enough, patient has started a new job about 4 weeks ago. She reports liking her new job and feels like she is adjusting well. She has previously been on Abilify  for mood disorders, however states she did not like Abilify  due to feelings of being zoned out. She denies thoughts of harming herself or others.       Review of Systems  Constitutional:  Negative for fever, malaise/fatigue and weight loss.  Respiratory:  Negative for cough and shortness of breath.   Cardiovascular:  Negative for chest pain and palpitations.  Gastrointestinal:  Positive for nausea and vomiting.  Neurological:  Positive for headaches.  Psychiatric/Behavioral:  Negative for depression. The patient is nervous/anxious.       Objective:     BP 138/81   Pulse 61   Temp 98.2 F (36.8 C)   Ht 5' 7 (1.702 m)   Wt 265 lb (120.2 kg)   LMP 08/22/2013   SpO2 98%   BMI 41.50 kg/m    Physical Exam Constitutional:      Appearance: Normal appearance.  HENT:     Head: Normocephalic.     Mouth/Throat:     Mouth: Mucous membranes are moist.     Pharynx: Oropharynx is clear.  Eyes:     Extraocular Movements: Extraocular movements intact.     Conjunctiva/sclera: Conjunctivae normal.  Cardiovascular:     Rate and Rhythm: Normal rate and regular rhythm.     Heart sounds: Normal heart sounds. No murmur heard. Pulmonary:     Effort: Pulmonary effort is normal.     Breath  sounds: Normal breath sounds. No wheezing or rales.  Skin:    General: Skin is warm and dry.  Neurological:     General: No focal deficit present.     Mental Status: She is alert and oriented to person, place, and time.  Psychiatric:        Mood and Affect: Mood normal.        Behavior: Behavior normal.        Thought Content: Thought content normal.     No results found for any visits on 09/28/23.  The 10-year ASCVD risk score (Arnett DK, et al., 2019) is: 2%    Assessment & Plan:   Return in about 6 weeks (around 11/09/2023) for anxiety .   Generalized anxiety disorder Assessment & Plan: Patient presents today with worsening anxiety. Discussed treatment options. Referral for therapy. Starting Buspar  7.5 mg bid. Patient counseled on side effects such as nausea and headache, she was advised to stop taking medication if she experiences suicidal ideation. Patient to follow up in 6 weeks.   Orders: -     busPIRone  HCl; Take 1 tablet (7.5 mg total) by mouth 2 (two) times daily.  Dispense: 60 tablet; Refill: 1 -     Ambulatory referral to Psychology  Other orders -     Ondansetron  HCl;  Take 1 tablet (4 mg total) by mouth every 8 (eight) hours as needed for nausea or vomiting.  Dispense: 20 tablet; Refill: 3    Taviana Westergren, PA-C

## 2023-11-09 ENCOUNTER — Encounter: Payer: Self-pay | Admitting: Family Medicine

## 2023-11-09 ENCOUNTER — Ambulatory Visit: Admitting: Family Medicine

## 2023-11-09 VITALS — BP 121/79 | HR 70 | Ht 67.0 in | Wt 263.0 lb

## 2023-11-09 DIAGNOSIS — I10 Essential (primary) hypertension: Secondary | ICD-10-CM

## 2023-11-09 DIAGNOSIS — Z130 Encounter for screening for diseases of the blood and blood-forming organs and certain disorders involving the immune mechanism: Secondary | ICD-10-CM

## 2023-11-09 DIAGNOSIS — F411 Generalized anxiety disorder: Secondary | ICD-10-CM | POA: Diagnosis not present

## 2023-11-09 DIAGNOSIS — Z23 Encounter for immunization: Secondary | ICD-10-CM | POA: Diagnosis not present

## 2023-11-09 DIAGNOSIS — R7303 Prediabetes: Secondary | ICD-10-CM | POA: Diagnosis not present

## 2023-11-09 DIAGNOSIS — E782 Mixed hyperlipidemia: Secondary | ICD-10-CM | POA: Diagnosis not present

## 2023-11-09 MED ORDER — BUSPIRONE HCL 7.5 MG PO TABS: 7.5000 mg | ORAL_TABLET | Freq: Two times a day (BID) | ORAL | 3 refills | Status: AC

## 2023-11-09 NOTE — Assessment & Plan Note (Signed)
 Improved. Continue Buspar . Refilled today.

## 2023-11-09 NOTE — Patient Instructions (Addendum)
 Influenza (Flu) Vaccine (Inactivated or Recombinant): What You Need to Know Many vaccine information statements are available in Spanish and other languages. See PromoAge.com.br. 1. Why get vaccinated? Influenza vaccine can prevent influenza (flu). Flu is a contagious disease that spreads around the [place]  every year, usually between October and May. Anyone can get the flu, but it is more dangerous for some people. Infants and young children, people 83 years and older, pregnant people, and people with certain health conditions or a weakened immune system are at greatest risk of flu complications. Pneumonia, bronchitis, sinus infections, and ear infections are examples of flu-related complications. If you have a medical condition, such as heart disease, cancer, or diabetes, flu can make it worse. Flu can cause fever and chills, sore throat, muscle aches, fatigue, cough, headache, and runny or stuffy nose. Some people may have vomiting and diarrhea, though this is more common in children than adults. In an average year, thousands of people in the [place]  die from flu, and many more are hospitalized. Flu vaccine prevents millions of illnesses and flu-related visits to the doctor each year. 2. Influenza vaccines CDC recommends everyone 6 months and older get vaccinated every flu season. Children 6 months through 66 years of age may need 2 doses during a single flu season. Everyone else needs only 1 dose each flu season. It takes about 2 weeks for protection to develop after vaccination. There are many flu viruses, and they are always changing. Each year a new flu vaccine is made to protect against the influenza viruses believed to be likely to cause disease in the upcoming flu season. Even when the vaccine doesn't exactly match these viruses, it may still provide some protection. Influenza vaccine does not cause flu. Influenza vaccine may be given at the same time as other vaccines. 3.  Talk with your health care provider Tell your vaccination provider if the person getting the vaccine: Has had an allergic reaction after a previous dose of influenza vaccine, or has any severe, life-threatening allergies Has ever had Guillain-Barr Syndrome (also called GBS) In some cases, your health care provider may decide to postpone influenza vaccination until a future visit. Influenza vaccine can be administered at any time during pregnancy. People who are or will be pregnant during influenza season should receive inactivated influenza vaccine. People with minor illnesses, such as a cold, may be vaccinated. People who are moderately or severely ill should usually wait until they recover before getting influenza vaccine. Your health care provider can give you more information. 4. Risks of a vaccine reaction Soreness, redness, and swelling where the shot is given, fever, muscle aches, and headache can happen after influenza vaccination. There may be a very small increased risk of Guillain-Barr Syndrome (GBS) after inactivated influenza vaccine (the flu shot). Young children who get the flu shot along with pneumococcal vaccine (PCV13) and/or DTaP vaccine at the same time might be slightly more likely to have a seizure caused by fever. Tell your health care provider if a child who is getting flu vaccine has ever had a seizure. People sometimes faint after medical procedures, including vaccination. Tell your provider if you feel dizzy or have vision changes or ringing in the ears. As with any medicine, there is a very remote chance of a vaccine causing a severe allergic reaction, other serious injury, or death. 5. What if there is a serious problem? An allergic reaction could occur after the vaccinated person leaves the clinic. If you see signs of  a severe allergic reaction (hives, swelling of the face and throat, difficulty breathing, a fast heartbeat, dizziness, or weakness), call 9-1-1 and get  the person to the nearest hospital. For other signs that concern you, call your health care provider. Adverse reactions should be reported to the Vaccine Adverse Event Reporting System (VAERS). Your health care provider will usually file this report, or you can do it yourself. Visit the VAERS website at www.vaers.LAgents.no or call 743 445 2391. VAERS is only for reporting reactions, and VAERS staff members do not give medical advice. 6. The National Vaccine Injury Compensation Program The [employer] Vaccine Injury Compensation Program (VICP) is a federal program that was created to compensate people who may have been injured by certain vaccines. Claims regarding alleged injury or death due to vaccination have a time limit for filing, which may be as short as two years. Visit the VICP website at SpiritualWord.at or call (539) 238-8104 to learn about the program and about filing a claim. 7. How can I learn more? Ask your health care provider. Call your local or state health department. Visit the website of the Food and Drug Administration (FDA) for vaccine package inserts and additional information at FinderList.no. Contact the Centers for Disease Control and Prevention (CDC): Call (702)353-3295 (1-800-CDC-INFO) or Visit CDC's website at BiotechRoom.com.cy. Source: CDC Vaccine Information Statement Inactivated Influenza Vaccine (10/11/2019) This same material is available at FootballExhibition.com.br for no charge. This information is not intended to replace advice given to you by your health care provider. Make sure you discuss any questions you have with your health care provider. Document Revised: 06/08/2022 Document Reviewed: 03/14/2022 Elsevier Patient Education  2024 Elsevier Inc.Labs at your convenience.  Follow up in 6 months.

## 2023-11-09 NOTE — Progress Notes (Signed)
 Subjective:  Patient ID: Katherine Davila, female    DOB: 1975-09-05  Age: 47 y.o. MRN: [id number]  CC:   Chief Complaint  Patient presents with   Anxiety    HPI:  48 year old female presents for follow up regarding anxiety.  She reports that she is improved on Buspar . Handling things better. Less overwhelmed. No additional nausea/vomiting. Needs refill on Buspar .  Patient Active Problem List   Diagnosis Date Noted   Generalized anxiety disorder 09/28/2023   Morbid obesity (HCC) 01/24/2022   Prediabetes 07/22/2021   Mixed hyperlipidemia 01/20/2021   Essential hypertension 04/08/2020   Urinary incontinence 10/03/2019   Bipolar disorder, mixed (HCC) 06/15/2019    Social Hx   Social History   Socioeconomic History   Marital status: Married    Spouse name: Not on file   Number of children: Not on file   Years of education: Not on file   Highest education level: Not on file  Occupational History   Occupation: nurse  Tobacco Use   Smoking status: Never   Smokeless tobacco: Never  Vaping Use   Vaping status: Never Used  Substance and Sexual Activity   Alcohol use: No   Drug use: No   Sexual activity: Yes    Birth control/protection: Surgical  Other Topics Concern   Not on file  Social History Narrative   Not on file   Social Drivers of Health   Financial Resource Strain: Low Risk  (04/27/2020)   Overall Financial Resource Strain (CARDIA)    Difficulty of Paying Living Expenses: Not hard at all  Food Insecurity: No Food Insecurity (04/27/2020)   Hunger Vital Sign    Worried About Running Out of Food in the Last Year: Never true    Ran Out of Food in the Last Year: Never true  Transportation Needs: No Transportation Needs (04/27/2020)   PRAPARE - Administrator, Civil Service (Medical): No    Lack of Transportation (Non-Medical): No  Physical Activity: Sufficiently Active (04/27/2020)   Exercise Vital Sign    Days of Exercise per Week: 4 days     Minutes of Exercise per Session: 100 min  Stress: Stress Concern Present (04/27/2020)   [employer] of Occupational Health - Occupational Stress Questionnaire    Feeling of Stress : Rather much  Social Connections: Socially Integrated (04/27/2020)   Social Connection and Isolation Panel    Frequency of Communication with Friends and Family: More than three times a week    Frequency of Social Gatherings with Friends and Family: Once a week    Attends Religious Services: More than 4 times per year    Active Member of Clubs or Organizations: Yes    Attends Banker Meetings: More than 4 times per year    Marital Status: Married    Review of Systems Per HPI  Objective:  BP 121/79   Pulse 70   Ht 5' 7 (1.702 m)   Wt 263 lb (119.3 kg)   LMP 08/22/2013   SpO2 97%   BMI 41.19 kg/m      11/09/2023    8:26 AM 09/28/2023    9:42 AM 03/16/2023    1:08 PM  BP/Weight  Systolic BP 121 138 120  Diastolic BP 79 81 82  Wt. (Lbs) 263 265 260  BMI 41.19 kg/m2 41.5 kg/m2 40.72 kg/m2    Physical Exam Vitals and nursing note reviewed.  Constitutional:      General: She is  not in acute distress.    Appearance: Normal appearance.  HENT:     Head: Normocephalic and atraumatic.  Eyes:     General:        Right eye: No discharge.        Left eye: No discharge.     Conjunctiva/sclera: Conjunctivae normal.  Cardiovascular:     Rate and Rhythm: Normal rate and regular rhythm.  Pulmonary:     Effort: Pulmonary effort is normal.     Breath sounds: Normal breath sounds. No wheezing, rhonchi or rales.  Neurological:     Mental Status: She is alert.  Psychiatric:        Mood and Affect: Mood normal.        Behavior: Behavior normal.     Lab Results  Component Value Date   WBC 7.8 01/17/2023   HGB 15.1 01/17/2023   HCT 44.4 01/17/2023   PLT 302 01/17/2023   GLUCOSE 91 01/17/2023   CHOL 231 (H) 01/17/2023   TRIG 122 01/17/2023   HDL 48 01/17/2023   LDLCALC 161 (H)  01/17/2023   ALT 14 01/17/2023   AST 14 01/17/2023   NA 141 01/17/2023   K 4.1 01/17/2023   CL 101 01/17/2023   CREATININE 0.83 01/17/2023   BUN 14 01/17/2023   CO2 23 01/17/2023   TSH 0.928 11/28/2013   HGBA1C 6.0 (H) 01/17/2023     Assessment & Plan:  Generalized anxiety disorder Assessment & Plan: Improved. Continue Buspar . Refilled today.  Orders: -     busPIRone  HCl; Take 1 tablet (7.5 mg total) by mouth 2 (two) times daily.  Dispense: 180 tablet; Refill: 3  Prediabetes -     CMP14+EGFR -     Hemoglobin A1c  Mixed hyperlipidemia -     Lipid panel  Essential hypertension -     Microalbumin / creatinine urine ratio  Screening for deficiency anemia -     CBC  Influenza vaccination given -     Flu vaccine trivalent PF, 6mos and older(Flulaval,Afluria,Fluarix,Fluzone)    Follow-up:  6 months  Elna Radovich Bluford DO [facility]

## 2023-11-10 LAB — CMP14+EGFR
ALT: 22 IU/L (ref 0–32)
AST: 17 IU/L (ref 0–40)
Albumin: 4.3 g/dL (ref 3.9–4.9)
Alkaline Phosphatase: 93 IU/L (ref 44–121)
BUN/Creatinine Ratio: 10 (ref 9–23)
BUN: 8 mg/dL (ref 6–24)
Bilirubin Total: 0.4 mg/dL (ref 0.0–1.2)
CO2: 20 mmol/L (ref 20–29)
Calcium: 9.9 mg/dL (ref 8.7–10.2)
Chloride: 101 mmol/L (ref 96–106)
Creatinine, Ser: 0.78 mg/dL (ref 0.57–1.00)
Globulin, Total: 2.7 g/dL (ref 1.5–4.5)
Glucose: 141 mg/dL — ABNORMAL HIGH (ref 70–99)
Potassium: 3.9 mmol/L (ref 3.5–5.2)
Sodium: 140 mmol/L (ref 134–144)
Total Protein: 7 g/dL (ref 6.0–8.5)
eGFR: 95 mL/min/1.73 (ref >59)

## 2023-11-10 LAB — CBC
Hematocrit: 45.8 % (ref 34.0–46.6)
Hemoglobin: 15.1 g/dL (ref 11.1–15.9)
MCH: 31.2 pg (ref 26.6–33.0)
MCHC: 33 g/dL (ref 31.5–35.7)
MCV: 95 fL (ref 79–97)
Platelets: 324 x10E3/uL (ref 150–450)
RBC: 4.84 x10E6/uL (ref 3.77–5.28)
RDW: 13.1 % (ref 11.7–15.4)
WBC: 9.4 x10E3/uL (ref 3.4–10.8)

## 2023-11-10 LAB — LIPID PANEL
Chol/HDL Ratio: 3.4 ratio (ref 0.0–4.4)
Cholesterol, Total: 153 mg/dL (ref 100–199)
HDL: 45 mg/dL (ref >39)
LDL Chol Calc (NIH): 71 mg/dL (ref 0–99)
Triglycerides: 223 mg/dL — ABNORMAL HIGH (ref 0–149)
VLDL Cholesterol Cal: 37 mg/dL (ref 5–40)

## 2023-11-10 LAB — MICROALBUMIN / CREATININE URINE RATIO
Creatinine, Urine: 225.6 mg/dL
Microalb/Creat Ratio: 16 mg/g{creat} (ref 0–29)
Microalbumin, Urine: 37 ug/mL

## 2023-11-10 LAB — HEMOGLOBIN A1C
Est. average glucose Bld gHb Est-mCnc: 131 mg/dL
Hgb A1c MFr Bld: 6.2 % — ABNORMAL HIGH (ref 4.8–5.6)

## 2023-11-12 ENCOUNTER — Ambulatory Visit: Payer: Self-pay | Admitting: Family Medicine

## 2024-02-04 ENCOUNTER — Other Ambulatory Visit: Payer: Self-pay | Admitting: Family Medicine

## 2024-03-07 ENCOUNTER — Other Ambulatory Visit: Payer: Self-pay | Admitting: Family Medicine

## 2024-03-22 ENCOUNTER — Other Ambulatory Visit: Payer: Self-pay | Admitting: Family Medicine

## 2024-04-09 ENCOUNTER — Other Ambulatory Visit: Payer: Self-pay | Admitting: Family Medicine

## 2024-05-08 ENCOUNTER — Ambulatory Visit: Admitting: Family Medicine
